# Patient Record
Sex: Male | Born: 1970 | Race: Black or African American | Hispanic: No | Marital: Married | Smoking: Current some day smoker
Health system: Southern US, Community
[De-identification: ages and names within clinical notes are randomized; demographics above are authoritative.]

## PROBLEM LIST (undated history)

## (undated) DIAGNOSIS — G4730 Sleep apnea, unspecified: Secondary | ICD-10-CM

## (undated) DIAGNOSIS — I10 Essential (primary) hypertension: Secondary | ICD-10-CM

---

## 2015-12-17 ENCOUNTER — Encounter (HOSPITAL_COMMUNITY): Payer: Self-pay | Admitting: Nurse Practitioner

## 2015-12-17 ENCOUNTER — Emergency Department (HOSPITAL_COMMUNITY)
Admission: EM | Admit: 2015-12-17 | Discharge: 2015-12-17 | Disposition: A | Discharge status: Home / Self Care | Payer: BLUE CROSS/BLUE SHIELD | Attending: Emergency Medicine | Admitting: Emergency Medicine

## 2015-12-17 DIAGNOSIS — Y939 Activity, unspecified: Secondary | ICD-10-CM | POA: Diagnosis not present

## 2015-12-17 DIAGNOSIS — T148XXA Other injury of unspecified body region, initial encounter: Secondary | ICD-10-CM

## 2015-12-17 DIAGNOSIS — M25562 Pain in left knee: Secondary | ICD-10-CM | POA: Insufficient documentation

## 2015-12-17 DIAGNOSIS — S76011A Strain of muscle, fascia and tendon of right hip, initial encounter: Secondary | ICD-10-CM | POA: Diagnosis not present

## 2015-12-17 DIAGNOSIS — F17210 Nicotine dependence, cigarettes, uncomplicated: Secondary | ICD-10-CM | POA: Diagnosis not present

## 2015-12-17 DIAGNOSIS — Y999 Unspecified external cause status: Secondary | ICD-10-CM | POA: Diagnosis not present

## 2015-12-17 DIAGNOSIS — Y929 Unspecified place or not applicable: Secondary | ICD-10-CM | POA: Diagnosis not present

## 2015-12-17 DIAGNOSIS — X58XXXA Exposure to other specified factors, initial encounter: Secondary | ICD-10-CM | POA: Diagnosis not present

## 2015-12-17 DIAGNOSIS — S79911A Unspecified injury of right hip, initial encounter: Secondary | ICD-10-CM | POA: Diagnosis present

## 2015-12-17 HISTORY — DX: Sleep apnea, unspecified: G47.30

## 2015-12-17 MED ORDER — NAPROXEN 500 MG PO TABS: 500.0000 mg | ORAL_TABLET | Freq: Two times a day (BID) | ORAL | Status: DC

## 2015-12-17 MED ORDER — CYCLOBENZAPRINE HCL 10 MG PO TABS: 10.0000 mg | ORAL_TABLET | Freq: Two times a day (BID) | ORAL | Status: DC | PRN

## 2015-12-17 NOTE — Discharge Instructions (Signed)
Joint Pain Joint pain, which is also called arthralgia, can be caused by many things. Joint pain often goes away when you follow your health care provider's instructions for relieving pain at home. However, joint pain can also be caused by conditions that require further treatment. Common causes of joint pain include:  Bruising in the area of the joint.  Overuse of the joint.  Wear and tear on the joints that occur with aging (osteoarthritis).  Various other forms of arthritis.  A buildup of a crystal form of uric acid in the joint (gout).  Infections of the joint (septic arthritis) or of the bone (osteomyelitis). Your health care provider may recommend medicine to help with the pain. If your joint pain continues, additional tests may be needed to diagnose your condition. HOME CARE INSTRUCTIONS Watch your condition for any changes. Follow these instructions as directed to lessen the pain that you are feeling.  Take medicines only as directed by your health care provider.  Rest the affected area for as long as your health care provider says that you should. If directed to do so, raise the painful joint above the level of your heart while you are sitting or lying down.  Do not do things that cause or worsen pain.  If directed, apply ice to the painful area:  Put ice in a plastic bag.  Place a towel between your skin and the bag.  Leave the ice on for 20 minutes, 2-3 times per day.  Wear an elastic bandage, splint, or sling as directed by your health care provider. Loosen the elastic bandage or splint if your fingers or toes become numb and tingle, or if they turn cold and blue.  Begin exercising or stretching the affected area as directed by your health care provider. Ask your health care provider what types of exercise are safe for you.  Keep all follow-up visits as directed by your health care provider. This is important. SEEK MEDICAL CARE IF:  Your pain increases, and  medicine does not help.  Your joint pain does not improve within 3 days.  You have increased bruising or swelling.  You have a fever.  You lose 10 lb (4.5 kg) or more without trying. SEEK IMMEDIATE MEDICAL CARE IF:  You are not able to move the joint.  Your fingers or toes become numb or they turn cold and blue.   This information is not intended to replace advice given to you by your health care provider. Make sure you discuss any questions you have with your health care provider.   Document Released: 05/24/2005 Document Revised: 06/14/2014 Document Reviewed: 03/05/2014 Elsevier Interactive Patient Education 2016 [employer] Groin Strain A groin strain (also called a groin pull) is an injury to the muscles or tendon on the upper inner part of the thigh. These muscles are called the adductor muscles or groin muscles. They are responsible for moving the leg across the body. A muscle strain occurs when a muscle is overstretched and some muscle fibers are torn. A groin strain can range from mild to severe depending on how many muscle fibers are affected and whether the muscle fibers are partially or completely torn.  Groin strains usually occur during exercise or participation in sports. The injury often happens when a sudden, violent force is placed on a muscle, stretching the muscle too far. A strain is more likely to occur when your muscles are not warmed up or if you are not properly conditioned. Depending on the  severity of the groin strain, recovery time may vary from a few weeks to several weeks. Severe injuries often require 4-6 weeks for recovery. In these cases, complete healing can take 4-5 months.  CAUSES   Stretching the groin muscles too far or too suddenly, often during side-to-side motion with an abrupt change in direction.  Putting repeated stress on the groin muscles over a long period of time.  Performing vigorous activity without properly stretching the groin muscles  beforehand. SYMPTOMS   Pain and tenderness in the groin area. This begins as sharp pain and persists as a dull ache.  Popping or snapping feeling when the injury occurs (for severe strains).  Swelling or bruising.  Muscle spasms.  Weakness in the leg.  Stiffness in the groin area with decreased ability to move the affected muscles. DIAGNOSIS  Your caregiver will perform a physical exam to diagnose a groin strain. You will be asked about your symptoms and how the injury occurred. X-rays are sometimes needed to rule out a broken bone or cartilage problems. Your caregiver may order a CT scan or MRI if a complete muscle tear is suspected. TREATMENT  A groin strain will often heal on its own. Your caregiver may prescribe medicines to help manage pain and swelling (anti-inflammatory medicine). You may be told to use crutches for the first few days to minimize your pain. HOME CARE INSTRUCTIONS   Rest. Do not use the strained muscle if it causes pain.  Put ice on the injured area.  Put ice in a plastic bag.  Place a towel between your skin and the bag.  Leave the ice on for 15-20 minutes, every 2-3 hours. Do this for the first 2 days after the injury.  Only take over-the-counter or prescription medicines as directed by your caregiver.  Wrap the injured area with an elastic bandage as directed by your caregiver.  Keep the injured leg raised (elevated).  Walk, stretch, and perform range-of-motion exercises to improve blood flow to the injured area. Only perform these activities if you can do so without any pain. To prevent muscle strains:  Warm up before exercise.  Develop proper conditioning and strength in the groin muscles. SEEK IMMEDIATE MEDICAL CARE IF:   You have increased pain or swelling in the affected area.   Your symptoms are not improving or are getting worse. MAKE SURE YOU:   Understand these instructions.  Will watch your condition.  Will get help right away  if you are not doing well or get worse.   This information is not intended to replace advice given to you by your health care provider. Make sure you discuss any questions you have with your health care provider.   Document Released: 01/20/2004 Document Revised: 05/10/2012 Document Reviewed: 01/26/2012 Elsevier Interactive Patient Education [employer]

## 2015-12-17 NOTE — ED Notes (Signed)
Pt reports R groin pain after having sexual intercourse this morning. Describes as a pulled muscle. Denies any urinary changes, bowel changes, testicular pain or swelling. Also c/o several month history of L knee pain. He is ambulatory, mae

## 2015-12-17 NOTE — ED Provider Notes (Signed)
CSN: [id number]     Arrival date & time 12/17/15  1320 History  By signing my name below, I, [facility] Medical GroupMarrissa Thornton, attest that this documentation has been prepared under the direction and in the presence of Jose Horsemanobert Haim Hansson, PA-C. Electronically Signed: Randell PatientMarrissa Thornton, ED Scribe. 12/17/2015. 1:48 PM.   Chief Complaint  Thornton presents with  . Leg Pain  . Knee Pain    The history is provided by the Thornton. No language interpreter was used.    HPI Comments: Jose MunroLavan Thornton is a 45 y.o. male who presents to the Emergency Department complaining of constant, moderate, right groin pain onset 11.5 hours ago. Pt states that he had sexual intercourse with his wife last night and earlier this morning followed by pain to his right groin. He reports difficulty moving secondary to pain and left knee pain that is baseline for him. Pain is worse with movement. Denies weakness, penile discharge, dysuria, or masses in the groin.  Past Medical History  Diagnosis Date  . Sleep apnea    History reviewed. No pertinent past surgical history. History reviewed. No pertinent family history. Social History  Substance Use Topics  . Smoking status: Current Every Day Smoker    Types: Cigarettes  . Smokeless tobacco: None  . Alcohol Use: Yes    Review of Systems  Genitourinary: Negative for dysuria and discharge.  Musculoskeletal: Positive for myalgias (right groin) and arthralgias (left knee, chronic).  All other systems reviewed and are negative.     Allergies  Review of Thornton's allergies indicates no known allergies.  Home Medications   Prior to Admission medications   Medication Sig Start Date End Date Taking? Authorizing Provider  cyclobenzaprine (FLEXERIL) 10 MG tablet Take 1 tablet (10 mg total) by mouth 2 (two) times daily as needed for muscle spasms. 12/17/15   Jose Horsemanobert Jaxxon Naeem, PA-C  naproxen (NAPROSYN) 500 MG tablet Take 1 tablet (500 mg total) by mouth 2 (two) times daily with a meal.  12/17/15   Jose Horsemanobert Jose Darko, PA-C   BP 185/85 mmHg  Pulse 69  Temp(Src) 98.6 F (37 C) (Oral)  Resp 17  SpO2 99% Physical Exam  Constitutional: He is oriented to person, place, and time. He appears well-developed and well-nourished.  HENT:  Head: Normocephalic and atraumatic.  Eyes: Conjunctivae and EOM are normal.  Neck: Normal range of motion.  Cardiovascular: Normal rate.   Pulmonary/Chest: Effort normal.  Abdominal: He exhibits no distension.  Genitourinary:  Chaperone present during exam, no penile discharge, no masses, no lesions, no testicular tenderness or abnormality, no hernia  Musculoskeletal: Normal range of motion.  Right lower extremity hip abductor muscles tender to palpation Range of motion and strength 5/5  Neurological: He is alert and oriented to person, place, and time.  Sensation intact throughout  Skin: Skin is dry.  Psychiatric: He has a normal mood and affect. His behavior is normal. Judgment and thought content normal.  Nursing note and vitals reviewed.   ED Course  Procedures   DIAGNOSTIC STUDIES: Oxygen Saturation is 99% on RA, normal by my interpretation.    COORDINATION OF CARE: 1:38 PM Will prescribe naproxen and Flexeril. Will provide pt with a referral to an orthopedist. Advised pt to follow-up with orthopedist for left knee pain. Will discharge pt. Discussed treatment plan with pt at bedside and pt agreed to plan.   MDM   Final diagnoses:  Muscle strain  Left knee pain    Thornton with symptoms consistent with muscle strain of right hip adductors.  No evidence of hernia, or other gross abnormality. Recommend orthopedic follow-up.  I personally performed the services described in this documentation, which was scribed in my presence. The recorded information has been reviewed and is accurate.      Jose Horseman, PA-C 12/17/15 1422  Gerhard Munch, MD 12/18/15 [phone]

## 2015-12-17 NOTE — ED Notes (Signed)
Declined W/C at D/C and was escorted to lobby by RN. 

## 2016-03-14 ENCOUNTER — Encounter (HOSPITAL_COMMUNITY): Payer: Self-pay | Admitting: Emergency Medicine

## 2016-03-14 ENCOUNTER — Emergency Department (HOSPITAL_COMMUNITY): Payer: BLUE CROSS/BLUE SHIELD

## 2016-03-14 DIAGNOSIS — Y939 Activity, unspecified: Secondary | ICD-10-CM | POA: Insufficient documentation

## 2016-03-14 DIAGNOSIS — Y92410 Unspecified street and highway as the place of occurrence of the external cause: Secondary | ICD-10-CM | POA: Insufficient documentation

## 2016-03-14 DIAGNOSIS — S161XXA Strain of muscle, fascia and tendon at neck level, initial encounter: Secondary | ICD-10-CM | POA: Diagnosis not present

## 2016-03-14 DIAGNOSIS — Y999 Unspecified external cause status: Secondary | ICD-10-CM | POA: Insufficient documentation

## 2016-03-14 DIAGNOSIS — R51 Headache: Secondary | ICD-10-CM | POA: Diagnosis not present

## 2016-03-14 DIAGNOSIS — F17210 Nicotine dependence, cigarettes, uncomplicated: Secondary | ICD-10-CM | POA: Insufficient documentation

## 2016-03-14 DIAGNOSIS — S0001XA Abrasion of scalp, initial encounter: Secondary | ICD-10-CM | POA: Insufficient documentation

## 2016-03-14 DIAGNOSIS — S39012A Strain of muscle, fascia and tendon of lower back, initial encounter: Secondary | ICD-10-CM | POA: Insufficient documentation

## 2016-03-14 DIAGNOSIS — R791 Abnormal coagulation profile: Secondary | ICD-10-CM | POA: Insufficient documentation

## 2016-03-14 DIAGNOSIS — S199XXA Unspecified injury of neck, initial encounter: Secondary | ICD-10-CM | POA: Diagnosis present

## 2016-03-14 LAB — URINE MICROSCOPIC-ADD ON

## 2016-03-14 LAB — CBC
HCT: 41.3 % (ref 39.0–52.0)
HEMOGLOBIN: 14.1 g/dL (ref 13.0–17.0)
MCH: 31.8 pg (ref 26.0–34.0)
MCHC: 34.1 g/dL (ref 30.0–36.0)
MCV: 93 fL (ref 78.0–100.0)
Platelets: 329 10*3/uL (ref 150–400)
RBC: 4.44 MIL/uL (ref 4.22–5.81)
RDW: 14.1 % (ref 11.5–15.5)
WBC: 8.4 10*3/uL (ref 4.0–10.5)

## 2016-03-14 LAB — URINALYSIS, ROUTINE W REFLEX MICROSCOPIC
Bilirubin Urine: NEGATIVE
GLUCOSE, UA: NEGATIVE mg/dL
KETONES UR: NEGATIVE mg/dL
LEUKOCYTES UA: NEGATIVE
NITRITE: NEGATIVE
PH: 6 (ref 5.0–8.0)
Protein, ur: NEGATIVE mg/dL
SPECIFIC GRAVITY, URINE: 1.025 (ref 1.005–1.030)

## 2016-03-14 LAB — I-STAT CHEM 8, ED
BUN: 7 mg/dL (ref 6–20)
CREATININE: 1 mg/dL (ref 0.61–1.24)
Calcium, Ion: 1.16 mmol/L (ref 1.15–1.40)
Chloride: 104 mmol/L (ref 101–111)
Glucose, Bld: 86 mg/dL (ref 65–99)
HEMATOCRIT: 43 % (ref 39.0–52.0)
HEMOGLOBIN: 14.6 g/dL (ref 13.0–17.0)
POTASSIUM: 3.5 mmol/L (ref 3.5–5.1)
Sodium: 142 mmol/L (ref 135–145)
TCO2: 28 mmol/L (ref 0–100)

## 2016-03-14 LAB — I-STAT CG4 LACTIC ACID, ED: Lactic Acid, Venous: 0.5 mmol/L (ref 0.5–1.9)
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
## 2016-03-14 NOTE — ED Notes (Signed)
C-collar applied in triage.

## 2016-03-15 ENCOUNTER — Emergency Department (HOSPITAL_COMMUNITY): Payer: BLUE CROSS/BLUE SHIELD

## 2016-03-15 ENCOUNTER — Encounter (HOSPITAL_COMMUNITY): Payer: Self-pay | Admitting: Radiology

## 2016-03-15 ENCOUNTER — Emergency Department (HOSPITAL_COMMUNITY)
Admission: EM | Admit: 2016-03-15 | Discharge: 2016-03-15 | Disposition: A | Discharge status: Home / Self Care | Payer: BLUE CROSS/BLUE SHIELD | Attending: Emergency Medicine | Admitting: Emergency Medicine

## 2016-03-15 DIAGNOSIS — S39012A Strain of muscle, fascia and tendon of lower back, initial encounter: Secondary | ICD-10-CM

## 2016-03-15 DIAGNOSIS — S161XXA Strain of muscle, fascia and tendon at neck level, initial encounter: Secondary | ICD-10-CM | POA: Diagnosis not present

## 2016-03-15 LAB — COMPREHENSIVE METABOLIC PANEL
ALBUMIN: 4.1 g/dL (ref 3.5–5.0)
ALT: 19 U/L (ref 17–63)
ANION GAP: 7 (ref 5–15)
AST: 25 U/L (ref 15–41)
Alkaline Phosphatase: 66 U/L (ref 38–126)
BILIRUBIN TOTAL: 0.7 mg/dL (ref 0.3–1.2)
BUN: 7 mg/dL (ref 6–20)
CHLORIDE: 106 mmol/L (ref 101–111)
CO2: 26 mmol/L (ref 22–32)
Calcium: 9.1 mg/dL (ref 8.9–10.3)
Creatinine, Ser: 1.05 mg/dL (ref 0.61–1.24)
GFR calc Af Amer: >60 mL/min (ref >60)
Glucose, Bld: 88 mg/dL (ref 65–99)
POTASSIUM: 3.4 mmol/L — AB (ref 3.5–5.1)
Sodium: 139 mmol/L (ref 135–145)
TOTAL PROTEIN: 7 g/dL (ref 6.5–8.1)

## 2016-03-15 LAB — ETHANOL

## 2016-03-15 LAB — PROTIME-INR
INR: 1.07
Prothrombin Time: 13.9 seconds (ref 11.4–15.2)

## 2016-03-15 MED ORDER — CYCLOBENZAPRINE HCL 10 MG PO TABS: 10.0000 mg | ORAL_TABLET | Freq: Two times a day (BID) | ORAL | 0 refills | Status: DC | PRN

## 2016-03-15 MED ORDER — NAPROXEN 500 MG PO TABS: 500.0000 mg | ORAL_TABLET | Freq: Two times a day (BID) | ORAL | 0 refills | Status: DC

## 2016-03-15 MED ORDER — HYDROCODONE-ACETAMINOPHEN 5-325 MG PO TABS
1.0000 | ORAL_TABLET | Freq: Once | ORAL | Status: AC
  Administered 2016-03-15: 1 via ORAL
  Filled 2016-03-15: qty 1

## 2016-03-15 MED ORDER — CYCLOBENZAPRINE HCL 10 MG PO TABS
5.0000 mg | ORAL_TABLET | Freq: Once | ORAL | Status: AC
  Administered 2016-03-15: 5 mg via ORAL
  Filled 2016-03-15: qty 1
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
## 2016-03-15 NOTE — ED Notes (Signed)
Pt stable, ambulatory, states understanding of discharge instructions 

## 2019-05-13 ENCOUNTER — Other Ambulatory Visit (HOSPITAL_COMMUNITY): Payer: Self-pay | Admitting: Genetic Counselor

## 2019-05-14 ENCOUNTER — Telehealth (HOSPITAL_COMMUNITY): Payer: Self-pay | Admitting: Genetic Counselor

## 2019-05-14 NOTE — Telephone Encounter (Signed)
I called Mr. Jose Thornton to discuss his negative carrier screening results for spinal muscular atrophy (SMA). 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 *3+80T>G SNP. This result reduces, but does not eliminate his risk to be a carrier for SMA. Based on the sensitivity of the screen, there is a 1 in 342 residual risk for Mr. Jose Thornton to be a carrier for SMA. Mr. Jose Thornton partner was identified to have a 1 in 90 chance of being a silent carrier for SMA. Given Mr. Jose Thornton negative results, the couple has a [phone] in [phone] chance to have a baby affected by SMA.  I asked Mr. Jose Thornton if he would like me to contact his partner, Jose Thornton by phone to inform her about these results. He agreed to this. Mr. Jose Thornton confirmed that he had no further questions at this time.  Buelah Manis, MS Genetic Counselor

## 2020-01-29 ENCOUNTER — Emergency Department (HOSPITAL_BASED_OUTPATIENT_CLINIC_OR_DEPARTMENT_OTHER): Payer: HRSA Program

## 2020-01-29 ENCOUNTER — Encounter (HOSPITAL_BASED_OUTPATIENT_CLINIC_OR_DEPARTMENT_OTHER): Payer: Self-pay | Admitting: *Deleted

## 2020-01-29 ENCOUNTER — Emergency Department (HOSPITAL_BASED_OUTPATIENT_CLINIC_OR_DEPARTMENT_OTHER)
Admission: EM | Admit: 2020-01-29 | Discharge: 2020-01-29 | Disposition: A | Discharge status: Home / Self Care | Payer: HRSA Program | Attending: Emergency Medicine | Admitting: Emergency Medicine

## 2020-01-29 ENCOUNTER — Other Ambulatory Visit: Payer: Self-pay

## 2020-01-29 DIAGNOSIS — R05 Cough: Secondary | ICD-10-CM | POA: Diagnosis present

## 2020-01-29 DIAGNOSIS — U071 COVID-19: Secondary | ICD-10-CM

## 2020-01-29 DIAGNOSIS — F17210 Nicotine dependence, cigarettes, uncomplicated: Secondary | ICD-10-CM | POA: Diagnosis not present

## 2020-01-29 LAB — SARS CORONAVIRUS 2 BY RT PCR (HOSPITAL ORDER, PERFORMED IN ~~LOC~~ HOSPITAL LAB): SARS Coronavirus 2: POSITIVE — AB

## 2020-01-29 MED ORDER — ACETAMINOPHEN 325 MG PO TABS
650.0000 mg | ORAL_TABLET | Freq: Once | ORAL | Status: AC
  Administered 2020-01-29: 650 mg via ORAL
  Filled 2020-01-29: qty 2

## 2020-01-29 MED ORDER — DEXAMETHASONE 6 MG PO TABS
6.0000 mg | ORAL_TABLET | Freq: Once | ORAL | Status: AC
  Administered 2020-01-29: 6 mg via ORAL
  Filled 2020-01-29: qty 1

## 2020-01-29 NOTE — ED Provider Notes (Signed)
[facility] EMERGENCY DEPARTMENT Provider Note   CSN: [id number] Arrival date & time: 01/29/20  1209     History Chief Complaint  Patient presents with  . Cough    Jose Thornton is a 49 y.o. male with past medical history significant for sleep apnea and tobacco abuse.  HPI Patient presents to emergency department today with chief complaint of cough, shortness of breath and nasal congestion x1 week.  He states his symptoms have progressively worsened since onset.  He had a negative Covid test [facility].  He has 2 young children that are positive for RSV.  Patient states his cough is nonproductive.  No shortness of breath after long coughing fit.  He has tried taking TheraFlu at home without symptom improvement.  He has received his first Covid back his second for tomorrow.  He denies fever, chills, hemoptysis, chest pain, abdominal pain, urinary symptoms, diarrhea, lower extremity edema, rash.     Past Medical History:  Diagnosis Date  . Sleep apnea     There are no problems to display for this patient.   History reviewed. No pertinent surgical history.     No family history on file.  Social History   Tobacco Use  . Smoking status: Current Every Day Smoker    Types: Cigarettes  . Smokeless tobacco: Never Used  Substance Use Topics  . Alcohol use: Yes  . Drug use: Yes    Types: Marijuana    Home Medications Prior to Admission medications   Medication Sig Start Date End Date Taking? Authorizing Provider  cyclobenzaprine (FLEXERIL) 10 MG tablet Take 1 tablet (10 mg total) by mouth 2 (two) times daily as needed for muscle spasms. 03/15/16   Horton, Mayer Masker, MD  naproxen (NAPROSYN) 500 MG tablet Take 1 tablet (500 mg total) by mouth 2 (two) times daily. 03/15/16   Horton, Mayer Masker, MD    Allergies    Patient has no known allergies.  Review of Systems   Review of Systems All other systems are reviewed and are negative for  acute change except as noted in the HPI.  Physical Exam Updated Vital Signs BP (!) 172/104   Pulse (!) 57   Temp 99.4 F (37.4 C) (Oral)   Resp 20   Ht 6\' 4"  (1.93 m)   Wt 129.7 kg   SpO2 98%   BMI 34.81 kg/m   Physical Exam Vitals and nursing note reviewed.  Constitutional:      General: He is not in acute distress.    Appearance: He is not ill-appearing.  HENT:     Head: Normocephalic and atraumatic.     Right Ear: Tympanic membrane and external ear normal.     Left Ear: Tympanic membrane and external ear normal.     Nose: Congestion present.     Mouth/Throat:     Mouth: Mucous membranes are moist.     Pharynx: Oropharynx is clear.  Eyes:     General: No scleral icterus.       Right eye: No discharge.        Left eye: No discharge.     Extraocular Movements: Extraocular movements intact.     Conjunctiva/sclera: Conjunctivae normal.     Pupils: Pupils are equal, round, and reactive to light.  Neck:     Vascular: No JVD.  Cardiovascular:     Rate and Rhythm: Normal rate and regular rhythm.     Pulses: Normal pulses.  Radial pulses are 2+ on the right side and 2+ on the left side.     Heart sounds: Normal heart sounds.  Pulmonary:     Comments: Lungs clear to auscultation in all fields. Symmetric chest rise. No wheezing, rales, or rhonchi.  Oxygen saturation is 98% on room air.  He is talking in full sentences.  No respiratory distress. Chest:     Chest wall: No tenderness.  Abdominal:     Comments: Abdomen is soft, non-distended, and non-tender in all quadrants. No rigidity, no guarding. No peritoneal signs.  Musculoskeletal:        General: Normal range of motion.     Cervical back: Normal range of motion.  Skin:    General: Skin is warm and dry.     Capillary Refill: Capillary refill takes less than 2 seconds.     Findings: No rash.  Neurological:     Mental Status: He is oriented to person, place, and time.     GCS: GCS eye subscore is 4. GCS verbal  subscore is 5. GCS motor subscore is 6.     Comments: Fluent speech, no facial droop.  Psychiatric:        Behavior: Behavior normal.     ED Results / Procedures / Treatments   Labs (all labs ordered are listed, but only abnormal results are displayed) Labs Reviewed  SARS CORONAVIRUS 2 BY RT PCR (HOSPITAL ORDER, PERFORMED IN [facility]) - Abnormal; Notable for the following components:      Result Value   SARS Coronavirus 2 POSITIVE (*)    All other components within normal limits    EKG EKG Interpretation  Date/Time:  Tuesday January 29 2020 12:52:28 EDT Ventricular Rate:  72 PR Interval:  144 QRS Duration: 138 QT Interval:  442 QTC Calculation: 483 R Axis:   -69 Text Interpretation: Normal sinus rhythm with sinus arrhythmia Right bundle branch block Left anterior fascicular block ** Bifascicular block ** Minimal voltage criteria for LVH, may be normal variant ( R in aVL ) Abnormal ECG rate is faster compared to 2017 Confirmed by Pricilla Loveless [phone]) on 01/29/2020 12:56:33 PM   Radiology DG Chest Portable 1 View  Result Date: 01/29/2020 CLINICAL DATA:  Cough, shortness of breath, fever.  COVID positive. EXAM: PORTABLE CHEST 1 VIEW COMPARISON:  Radiograph 01/15/2016 FINDINGS: Mild cardiomegaly. Unchanged mediastinal contours. Pleuroparenchymal opacity at the left lung base was also seen on 2017 exam and may be chronic. There are new patchy opacities at the right mid and lower lung zone. Small right pleural effusion not seen on prior exam. No pneumothorax. IMPRESSION: 1. Patchy opacities at the right mid and lower lung zone, consistent pneumonia. 2. Small right pleural effusion. 3. Unchanged pleuroparenchymal opacity at the left lung base since 2017, may represent scarring 4. Mild cardiomegaly. Electronically Signed   By: Narda Rutherford M.D.   On: 01/29/2020 15:11    Procedures Procedures (including critical care time)  Medications Ordered in ED Medications    acetaminophen (TYLENOL) tablet 650 mg (650 mg Oral Given 01/29/20 1452)  dexamethasone (DECADRON) tablet 6 mg (6 mg Oral Given 01/29/20 1536)    ED Course  I have reviewed the triage vital signs and the nursing notes.  Pertinent labs & imaging results that were available during my care of the patient were reviewed by me and considered in my medical decision making (see chart for details).    MDM Rules/Calculators/A&P  History provided by patient with additional history obtained from chart review.    49 yo male presenting with URI symptoms. He has sick contacts +RSV at home. He is afebrile, no tachycardia, or hypoxia.  On exam he is well appearing. He has no sinus tenderness.  Does not appear dehydrated.  He does have nasal congestion.  His lungs are clear to auscultation in all fields.  He has normal work of breathing. Covid test is positive. Chest x-ray viewed by me shows patchy opacities at the right mid and lower lung zone, suggestive of pneumonia. Also small right pleural effusion. EKG without stemi. Patient ambulated in the emergency department without respiratory distress or hypoxia, SpO2 >95% on room air.  Case discussed with ED attending Dr. Criss Alvine who agrees with plan to discharge with symptomatic treatment as he has reassuring exam and vitals. Referral sent to [facility] infusion as he meets criteria with BMI. Also recommend telemedicine PCP f/u in the next 2-3 days for persistent symptoms  for further guidance. Self-isolation instructions discussed. Pt was given home self-isolation instructions and instructions for family members.   Pt understands signs and symptoms that would warrant return to ED.  Pt comfortable and agreeable with POC.  Jose Thornton was evaluated in Emergency Department on 01/29/2020 for the symptoms described in the history of present illness. He was evaluated in the context of the global COVID-19 pandemic, which necessitated consideration that  the patient might be at risk for infection with the SARS-CoV-2 virus that causes COVID-19. Institutional protocols and algorithms that pertain to the evaluation of patients at risk for COVID-19 are in a state of rapid change based on information released by regulatory bodies including the CDC and federal and state organizations. These policies and algorithms were followed during the patient's care in the ED.   Portions of this note were generated with Scientist, clinical (histocompatibility and immunogenetics). Dictation errors may occur despite best attempts at proofreading.  Final Clinical Impression(s) / ED Diagnoses Final diagnoses:  COVID-19 virus infection    Rx / DC Orders ED Discharge Orders    None       Kathyrn Lass 01/29/20 2333    Pricilla Loveless, MD 02/01/20 [phone]

## 2020-01-29 NOTE — ED Triage Notes (Signed)
Cough, sob, congestion. His kids are positive for RSV. He had a negative Covid test yesterday.

## 2020-01-29 NOTE — ED Notes (Signed)
Pt. Reports he [facility] his covid test [facility].

## 2020-01-29 NOTE — ED Notes (Signed)
Pt. Able to walk around  With sats staying at 94% to 96% on RA.  Pt. Has cough with speech.

## 2020-01-29 NOTE — Discharge Instructions (Addendum)
You tested positive for Covid today.   A provider from the infusion clinic will call you to schedule the clonal antibody infusion.  Thank you for allowing [place] to care for you today.   Please return to the emergency department if you have any new or worsening symptoms.    Medications- You can take medications to help treat your symptoms: -Tylenol for fever and body aches. Please take as prescribed on the bottle. -Over the coutner cough medicine such as mucinex, robitussin, or other brands. -Flonase or saline nasal spray for nasal congestion -Vitamins as recommended by CDC  Treatment- This is a virus and unfortunately there are no antibitotics approved to treat this virus at this time. It is important to monitor your symptoms closely: -You should have a theremometer at home to check your temperature when feeling feverish. -Use a pulse ox meter to measure your oxygen when feeling short of breath.  -If your fever is over 100.4 despite taking tylenol or if your oxygen level drops below 94% these are reasons to rturn to the emergency department for further evaluation. Please call the emergency department before you come to make [place] aware.    We recommend you self-isolate for 10 days and to inform your work/family/friends that you has the virus.  They will need to self-quarantine for 14 days to monitor for symptoms.    Again: symptoms of shortness of breath, chest pain, difficulty breathing, new onset of confusion, any symptoms that are concerning. If any of these symptoms you should come to emergency department for evaluation.   I hope you feel better soon

## 2020-01-29 NOTE — ED Notes (Signed)
Pt discharged to home. Discharge instructions have been discussed with patient and/or family members. Pt verbally acknowledges understanding d/c instructions, and endorses comprehension that no need to checkout at registration before leaving.

## 2020-01-30 ENCOUNTER — Telehealth: Payer: Self-pay | Admitting: Unknown Physician Specialty

## 2020-01-30 NOTE — Telephone Encounter (Signed)
Called to discuss with Jose Thornton about Covid symptoms and the use of bamlanivimab, a monoclonal antibody infusion for those with mild to moderate Covid symptoms and at a high risk of hospitalization.     Pt is qualified for this infusion at the [facility] infusion center due to co-morbid conditions and/or a member of an at-risk group, however declines infusion at this time. Symptoms tier reviewed as well as criteria for ending isolation.  Symptoms reviewed that would warrant ED/Hospital evaluation. Preventative practices reviewed. Patient verbalized understanding. Patient advised to call back if he decides that he does want to get infusion. Callback number to the infusion center given. Patient advised to go to Urgent care or ED with severe symptoms. Unable to afford the cost of infusion plus beyond 7 days, the optimal window for benefit.    Sx onset 8/16

## 2021-02-06 ENCOUNTER — Ambulatory Visit (HOSPITAL_COMMUNITY)
Admission: EM | Admit: 2021-02-06 | Discharge: 2021-02-06 | Disposition: A | Discharge status: Home / Self Care | Payer: 59 | Attending: Emergency Medicine | Admitting: Emergency Medicine

## 2021-02-06 ENCOUNTER — Encounter (HOSPITAL_COMMUNITY): Payer: Self-pay | Admitting: Emergency Medicine

## 2021-02-06 DIAGNOSIS — Z20822 Contact with and (suspected) exposure to covid-19: Secondary | ICD-10-CM | POA: Insufficient documentation

## 2021-02-06 DIAGNOSIS — J069 Acute upper respiratory infection, unspecified: Secondary | ICD-10-CM | POA: Insufficient documentation

## 2021-02-06 LAB — SARS CORONAVIRUS 2 (TAT 6-24 HRS): SARS Coronavirus 2: NEGATIVE

## 2021-02-06 NOTE — Discharge Instructions (Signed)

## 2021-02-06 NOTE — ED Triage Notes (Signed)
Pt is present today with right ear pain, nasal congestion, chest congestion, and sore throat. Pt states that his sx started Wednesday

## 2021-02-06 NOTE — ED Provider Notes (Signed)
[facility]    CSN: [id number] Arrival date & time: 02/06/21  0808      History   Chief Complaint Chief Complaint  Patient presents with   Nasal Congestion   Otalgia   Sore Throat    HPI Jose Thornton is a 50 y.o. male.   Patient here for evaluation of nasal congestion, ear pain, and sore throat that have been ongoing since Wednesday.  Reports taking Tylenol with minimal relief.  Denies any recent sick contacts but does state that family recently had COVID several weeks ago.  Denies any trauma, injury, or other precipitating event.  Denies any specific alleviating or aggravating factors.  Denies any fevers, chest pain, shortness of breath, N/V/D, numbness, tingling, weakness, abdominal pain, or headaches.    The history is provided by the patient.  Otalgia Associated symptoms: congestion, cough, rhinorrhea and sore throat   Associated symptoms: no fever   Sore Throat Pertinent negatives include no chest pain and no shortness of breath.   Past Medical History:  Diagnosis Date   Sleep apnea     There are no problems to display for this patient.   History reviewed. No pertinent surgical history.     Home Medications    Prior to Admission medications   Medication Sig Start Date End Date Taking? Authorizing Provider  cyclobenzaprine (FLEXERIL) 10 MG tablet Take 1 tablet (10 mg total) by mouth 2 (two) times daily as needed for muscle spasms. 03/15/16   Horton, Mayer Masker, MD  naproxen (NAPROSYN) 500 MG tablet Take 1 tablet (500 mg total) by mouth 2 (two) times daily. 03/15/16   Horton, Mayer Masker, MD    Family History History reviewed. No pertinent family history.  Social History Social History   Tobacco Use   Smoking status: Every Day    Types: Cigarettes   Smokeless tobacco: Never  Substance Use Topics   Alcohol use: Yes   Drug use: Yes    Types: Marijuana     Allergies   Patient has no known allergies.   Review of Systems Review of  Systems  Constitutional:  Negative for fever.  HENT:  Positive for congestion, ear pain, rhinorrhea, sore throat and trouble swallowing.   Respiratory:  Positive for cough. Negative for chest tightness and shortness of breath.   Cardiovascular:  Negative for chest pain.  All other systems reviewed and are negative.   Physical Exam Triage Vital Signs ED Triage Vitals  Enc Vitals Group     BP 02/06/21 0824 (!) 184/117     Pulse Rate 02/06/21 0822 68     Resp 02/06/21 0822 18     Temp 02/06/21 0822 98.9 F (37.2 C)     Temp Source 02/06/21 0822 Oral     SpO2 02/06/21 0824 93 %     Weight --      Height --      Head Circumference --      Peak Flow --      Pain Score 02/06/21 0826 0     Pain Loc --      Pain Edu? --      Excl. in GC? --    No data found.  Updated Vital Signs BP (!) 184/117 Comment: 203/117  Pulse 68   Temp 98.9 F (37.2 C) (Oral)   Resp 18   SpO2 93%   Visual Acuity Right Eye Distance:   Left Eye Distance:   Bilateral Distance:    Right Eye Near:  Left Eye Near:    Bilateral Near:     Physical Exam Vitals and nursing note reviewed.  Constitutional:      General: He is not in acute distress.    Appearance: Normal appearance. He is not ill-appearing, toxic-appearing or diaphoretic.  HENT:     Head: Normocephalic and atraumatic.     Right Ear: Tympanic membrane and ear canal normal.     Left Ear: Tympanic membrane and ear canal normal.     Nose: Congestion and rhinorrhea present.     Mouth/Throat:     Pharynx: Uvula midline. Pharyngeal swelling and posterior oropharyngeal erythema present. No oropharyngeal exudate.     Tonsils: No tonsillar exudate or tonsillar abscesses. 2+ on the right. 2+ on the left.  Eyes:     Conjunctiva/sclera: Conjunctivae normal.     Pupils: Pupils are equal, round, and reactive to light.  Cardiovascular:     Rate and Rhythm: Normal rate and regular rhythm.     Pulses: Normal pulses.     Heart sounds: Normal heart  sounds.  Pulmonary:     Effort: Pulmonary effort is normal.     Breath sounds: Normal breath sounds.  Abdominal:     General: Abdomen is flat.  Musculoskeletal:        General: Normal range of motion.     Cervical back: Normal range of motion.  Skin:    General: Skin is warm and dry.  Neurological:     General: No focal deficit present.     Mental Status: He is alert and oriented to person, place, and time.  Psychiatric:        Mood and Affect: Mood normal.     UC Treatments / Results  Labs (all labs ordered are listed, but only abnormal results are displayed) Labs Reviewed  SARS CORONAVIRUS 2 (TAT 6-24 HRS)    EKG   Radiology No results found.  Procedures Procedures (including critical care time)  Medications Ordered in UC Medications - No data to display  Initial Impression / Assessment and Plan / UC Course  I have reviewed the triage vital signs and the nursing notes.  Pertinent labs & imaging results that were available during my care of the patient were reviewed by me and considered in my medical decision making (see chart for details).  Clinical Course as of 02/06/21 0851  Fri Feb 06, 2021  0827 BP(!): 184/117 [FS]    Clinical Course User Index [FS] Ivette Loyal, NP   Assessment negative for red flags or concerns.  COVID test pending.  Discussed current CDC guidelines for quarantine.  Work note given to patient.  Tylenol and/or ibuprofen as needed.  Discussed conservative symptom management as described in discharge instructions.  Encourage fluids and rest.  Follow-up as needed Final Clinical Impressions(s) / UC Diagnoses   Final diagnoses:  Viral URI     Discharge Instructions      We will contact you if your COVID test is positive.  Please quarantine while you wait for the results.  If your test is negative you may resume normal activities.  If your test is positive please continue to quarantine for at least 5 days from your symptom onset or  until you are without a fever for at least 24 hours after the medications.  You can take Tylenol and/or Ibuprofen as needed for fever reduction and pain relief.   For cough: honey 1/2 to 1 teaspoon (you can dilute the honey in water or  another fluid).  You can also use guaifenesin and dextromethorphan for cough. You can use a humidifier for chest congestion and cough.  If you don't have a humidifier, you can sit in the bathroom with the hot shower running.     For sore throat: try warm salt water gargles, cepacol lozenges, throat spray, warm tea or water with lemon/honey, popsicles or ice, or OTC cold relief medicine for throat discomfort.    For congestion: take a daily anti-histamine like Zyrtec, Claritin, and a oral decongestant, such as pseudoephedrine.  You can also use Flonase 1-2 sprays in each nostril daily.    It is important to stay hydrated: drink plenty of fluids (water, gatorade/powerade/pedialyte, juices, or teas) to keep your throat moisturized and help further relieve irritation/discomfort.   Return or go to the Emergency Department if symptoms worsen or do not improve in the next few days.      ED Prescriptions   None    PDMP not reviewed this encounter.   Ivette Loyal, NP 02/06/21 [phone]

## 2022-02-15 IMAGING — DX DG CHEST 1V PORT
1 series · 1 of 1 positions shown · non-contrast
Comparison: Radiograph 01/15/2016

CLINICAL DATA: Cough, shortness of breath, fever.  COVID positive.

EXAM:
PORTABLE CHEST 1 VIEW

[chest ap]
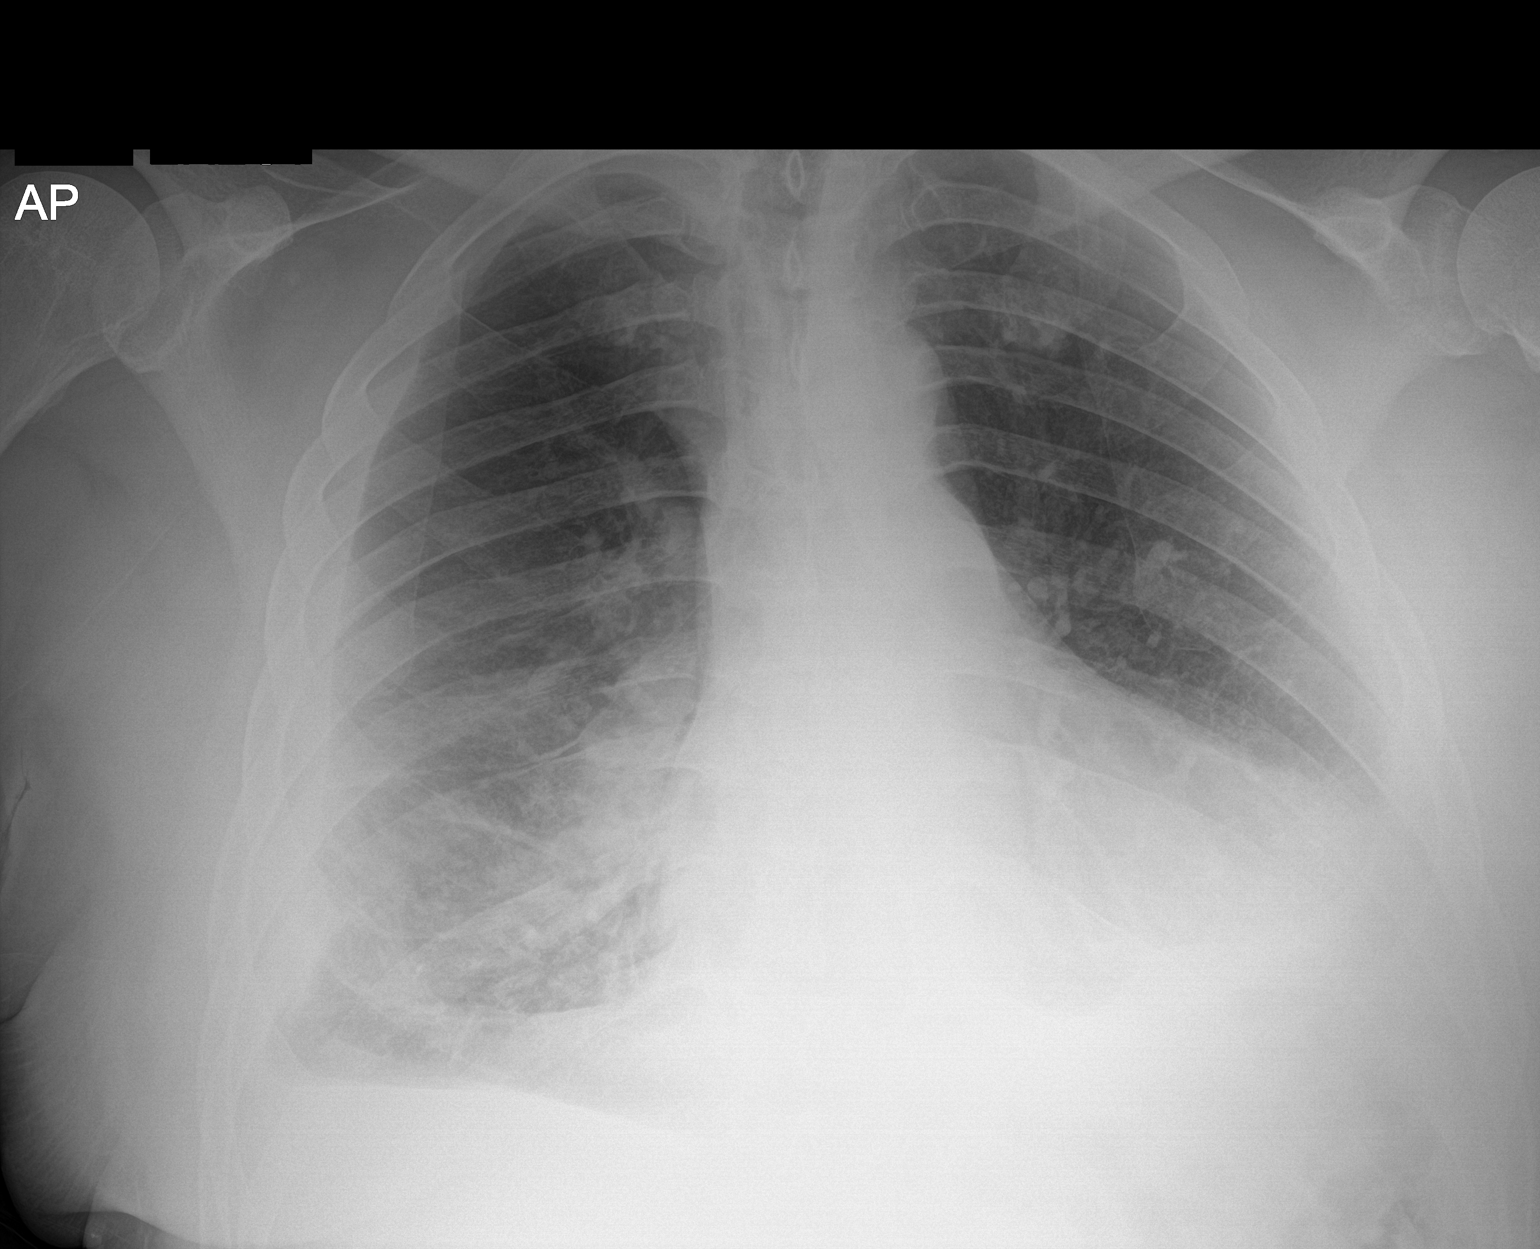

[1 of 1 positions shown; findings below may reference images not displayed]

FINDINGS: Mild cardiomegaly. Unchanged mediastinal contours. Pleuroparenchymal
opacity at the left lung base was also seen on 0428 exam and may be
chronic. There are new patchy opacities at the right mid and lower
lung zone. Small right pleural effusion not seen on prior exam. No
pneumothorax.
IMPRESSION: 1. Patchy opacities at the right mid and lower lung zone, consistent
pneumonia.
2. Small right pleural effusion.
3. Unchanged pleuroparenchymal opacity at the left lung base since
[DATE] represent scarring
4. Mild cardiomegaly.

## 2022-03-13 ENCOUNTER — Telehealth: Payer: Self-pay

## 2022-03-13 ENCOUNTER — Ambulatory Visit
Admission: EM | Admit: 2022-03-13 | Discharge: 2022-03-13 | Disposition: A | Discharge status: Home / Self Care | Payer: 59 | Attending: Emergency Medicine | Admitting: Emergency Medicine

## 2022-03-13 DIAGNOSIS — S025XXA Fracture of tooth (traumatic), initial encounter for closed fracture: Secondary | ICD-10-CM

## 2022-03-13 DIAGNOSIS — K047 Periapical abscess without sinus: Secondary | ICD-10-CM | POA: Diagnosis not present

## 2022-03-13 MED ORDER — PENICILLIN G BENZATHINE 1200000 UNIT/2ML IM SUSY
2.4000 10*6.[IU] | PREFILLED_SYRINGE | Freq: Once | INTRAMUSCULAR | Status: AC
  Administered 2022-03-13: 2.4 10*6.[IU] via INTRAMUSCULAR

## 2022-03-13 MED ORDER — AMOXICILLIN-POT CLAVULANATE 875-125 MG PO TABS: 1.0000 | ORAL_TABLET | Freq: Two times a day (BID) | ORAL | 0 refills | Status: AC

## 2022-03-13 MED ORDER — IBUPROFEN 800 MG PO TABS
800.0000 mg | ORAL_TABLET | Freq: Once | ORAL | Status: AC
  Administered 2022-03-13: 800 mg via ORAL

## 2022-03-13 MED ORDER — AMOXICILLIN-POT CLAVULANATE 875-125 MG PO TABS: 1.0000 | ORAL_TABLET | Freq: Two times a day (BID) | ORAL | 0 refills | Status: DC

## 2022-03-13 NOTE — ED Provider Notes (Signed)
[facility]    CSN: [id number] Arrival date & time: 03/13/22  6440    HISTORY   Chief Complaint  Patient presents with   Dental Pain   HPI Jose Thornton is a pleasant, 51 y.o. male who presents to urgent care today. Pt c/o throbbing dental pain to left lower row of teeth and now has swelling to his face.  Patient states his #18 molar broke a few weeks ago but has not been bothering him until last night.  Patient states he began to be very painful so he put some Orajel on it before going to work third shift last night.  Patient states the pain is becoming bearable and overnight began to have swelling of his jaw which is now spread all the way up the left side of his face, states his jaw is tender to palpation and feels warm.  Patient denies fever, headache, otalgia, difficulty swallowing.   Dental Pain Location:  Lower Lower teeth location:  18/LL 2nd molar Quality:  Aching, burning, constant and localized Severity:  Severe Onset quality:  Sudden Duration:  12 hours Timing:  Constant Progression:  Worsening Chronicity:  New Context: abscess and dental fracture   Relieved by:  Nothing Worsened by:  Cold food/drink, hot food/drink, touching, jaw movement and pressure Ineffective treatments:  Topical anesthetic gel Associated symptoms: facial pain, facial swelling and gum swelling   Associated symptoms: no congestion, no difficulty swallowing, no drooling, no fever, no headaches, no neck pain, no neck swelling, no oral bleeding, no oral lesions and no trismus   Risk factors: lack of dental care and smoking    Past Medical History:  Diagnosis Date   Sleep apnea    There are no problems to display for this patient.  History reviewed. No pertinent surgical history.  Home Medications    Prior to Admission medications   Medication Sig Start Date End Date Taking? Authorizing Provider    Family History History reviewed. No pertinent family history. Social  History Social History   Tobacco Use   Smoking status: Every Day    Types: Cigarettes   Smokeless tobacco: Never  Substance Use Topics   Alcohol use: Yes   Drug use: Yes    Types: Marijuana   Allergies   Patient has no known allergies.  Review of Systems Review of Systems  Constitutional:  Negative for fever.  HENT:  Positive for facial swelling. Negative for congestion, drooling and mouth sores.   Musculoskeletal:  Negative for neck pain.  Neurological:  Negative for headaches.   Pertinent findings revealed after performing a 14 point review of systems has been noted in the history of present illness.  Physical Exam Triage Vital Signs ED Triage Vitals  Enc Vitals Group     BP 04/03/21 0827 (!) 147/82     Pulse Rate 04/03/21 0827 72     Resp 04/03/21 0827 18     Temp 04/03/21 0827 98.3 F (36.8 C)     Temp Source 04/03/21 0827 Oral     SpO2 04/03/21 0827 98 %     Weight --      Height --      Head Circumference --      Peak Flow --      Pain Score 04/03/21 0826 5     Pain Loc --      Pain Edu? --      Excl. in [place]? --   No data found.  Updated Vital Signs BP Marland Kitchen)  166/98 (BP Location: Left Arm)   Pulse (!) 58   Temp 98.1 F (36.7 C) (Oral)   Resp 18   SpO2 95%   Physical Exam  Visual Acuity Right Eye Distance:   Left Eye Distance:   Bilateral Distance:    Right Eye Near:   Left Eye Near:    Bilateral Near:     UC Couse / Diagnostics / Procedures:     Radiology No results found.  Procedures Procedures (including critical care time) EKG  Pending results:  Labs Reviewed - No data to display  Medications Ordered in UC: Medications  penicillin g benzathine (BICILLIN LA) 1200000 UNIT/2ML injection 2.4 Million Units (has no administration in time range)  ibuprofen (ADVIL) tablet 800 mg (has no administration in time range)    UC Diagnoses / Final Clinical Impressions(s)   I have reviewed the triage vital signs and the nursing  notes.  Pertinent labs & imaging results that were available during my care of the patient were reviewed by me and considered in my medical decision making (see chart for details).    Final diagnoses:  Closed fracture of tooth, initial encounter  Dental abscess   Patient provided with ibuprofen 800 mg and injection of penicillin regularly for symptoms, patient states he was at work Mid[employer].  Patient also advised to pick up and begin taking Augmentin, first dose this evening.  Patient given contact information for local dentist and advised to schedule appointment as soon as possible to have acute repair.  Removed to prevent further infection.  Return precautions advised.  ED Prescriptions     Medication Sig Dispense Auth. Provider   amoxicillin-clavulanate (AUGMENTIN) 875-125 MG tablet Take 1 tablet by mouth 2 (two) times daily for 7 days. 14 tablet Lynden Oxford Scales, PA-C      PDMP not reviewed this encounter.  Pending results:  Labs Reviewed - No data to display  Discharge Instructions:   Discharge Instructions      During your visit today you received an injection of penicillin that should significantly reduce your pain and swelling.  We also provided you with ibuprofen to reduce the rest of your pain.  I sent a prescription for ibuprofen to your pharmacy as well.  Please pick up and begin taking Augmentin twice daily, please take your first dose this evening.  Please be sure that you finish the full 7-day course.  Because your tooth is broken, reinfection is very likely until the tooth is either repaired or removed.  Please make a dental appointment as soon as possible for evaluation and treatment of the tooth.  Thank you for visiting urgent care today.  Please let us know if there is nothing else we can do for you.      Disposition Upon Discharge:  Condition: stable for discharge home  Patient presented with an acute illness with associated systemic symptoms and  significant discomfort requiring urgent management. In my opinion, this is a condition that a prudent lay person (someone who possesses an average knowledge of health and medicine) may potentially expect to result in complications if not addressed urgently such as respiratory distress, impairment of bodily function or dysfunction of bodily organs.   Routine symptom specific, illness specific and/or disease specific instructions were discussed with the patient and/or caregiver at length.   As such, the patient has been evaluated and assessed, work-up was performed and treatment was provided in alignment with urgent care protocols and evidence based medicine.  Patient/parent/caregiver has been  advised that the patient may require follow up for further testing and treatment if the symptoms continue in spite of treatment, as clinically indicated and appropriate.  Patient/parent/caregiver has been advised to return to the [facility] or PCP if no better; to PCP or the Emergency Department if new signs and symptoms develop, or if the current signs or symptoms continue to change or worsen for further workup, evaluation and treatment as clinically indicated and appropriate  The patient will follow up with their current PCP if and as advised. If the patient does not currently have a PCP we will assist them in obtaining one.   The patient may need specialty follow up if the symptoms continue, in spite of conservative treatment and management, for further workup, evaluation, consultation and treatment as clinically indicated and appropriate.   Patient/parent/caregiver verbalized understanding and agreement of plan as discussed.  All questions were addressed during visit.  Please see discharge instructions below for further details of plan.  This office note has been dictated using Teaching laboratory technician.  Unfortunately, this method of dictation can sometimes lead to typographical or grammatical errors.  I  apologize for your inconvenience in advance if this occurs.  Please do not hesitate to reach out to me if clarification is needed.      Theadora Rama Scales, PA-C 03/13/22 1308

## 2022-03-13 NOTE — ED Triage Notes (Signed)
Pt c/o throbbing dental pain to left lower row of teeth and now has swelling to his face.   Started: yesterday   Home interventions: Orajel

## 2022-03-13 NOTE — Discharge Instructions (Addendum)
During your visit today you received an injection of penicillin that should significantly reduce your pain and swelling.  We also provided you with ibuprofen to reduce the rest of your pain.  I sent a prescription for ibuprofen to your pharmacy as well.  Please pick up and begin taking Augmentin twice daily, please take your first dose this evening.  Please be sure that you finish the full 7-day course.  Because your tooth is broken, reinfection is very likely until the tooth is either repaired or removed.  Please make a dental appointment as soon as possible for evaluation and treatment of the tooth.  Thank you for visiting urgent care today.  Please let us know if there is nothing else we can do for you.

## 2022-06-01 ENCOUNTER — Ambulatory Visit
Admission: EM | Admit: 2022-06-01 | Discharge: 2022-06-01 | Disposition: A | Discharge status: Home / Self Care | Payer: 59 | Attending: Emergency Medicine | Admitting: Emergency Medicine

## 2022-06-01 DIAGNOSIS — I160 Hypertensive urgency: Secondary | ICD-10-CM

## 2022-06-01 DIAGNOSIS — R0602 Shortness of breath: Secondary | ICD-10-CM | POA: Diagnosis not present

## 2022-06-01 DIAGNOSIS — R062 Wheezing: Secondary | ICD-10-CM | POA: Diagnosis not present

## 2022-06-01 DIAGNOSIS — R7611 Nonspecific reaction to tuberculin skin test without active tuberculosis: Secondary | ICD-10-CM | POA: Diagnosis not present

## 2022-06-01 MED ORDER — CLONIDINE HCL 0.3 MG PO TABS
0.3000 mg | ORAL_TABLET | Freq: Once | ORAL | Status: AC
  Administered 2022-06-01: 0.3 mg via ORAL

## 2022-06-01 NOTE — Discharge Instructions (Signed)
Please go to the emergency room now for further treatment of your elevated blood pressure and wheezing.

## 2022-06-01 NOTE — ED Provider Notes (Signed)
[facility]    CSN: [id number] Arrival date & time: 06/01/22  1823    HISTORY   Chief Complaint  Patient presents with   Cough   HPI Jose Thornton is a pleasant, 51 y.o. male who presents to urgent care today. Patient complains of coughing and chest congestion for the past 3 days.  Patient states he is coughed so hard the past 2 days that he is lost his voice.  On arrival, patient's blood pressure was 201/116.  Patient states he has not been taking his blood pressure medication, amlodipine olmesartan.  Patient was provided with clonidine 0.3 mg on arrival.  Patient's blood pressure was checked at 15-minute intervals,Over the course of 45 minutes no progress was made in lowering his blood pressure with clonidine 0.3 mg.  Patient reports being a current every day smoker of cigarettes, also uses marijuana recreationally.  Patient denies chest pain, shortness of breath, chest pressure, altered mental status, history of allergies or asthma.  Patient has been diagnosed with sleep apnea, states not currently using his CPAP machine.  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  Chest x-ray performed revealed bilateral pleural effusions and pulmonary opacities, patient was referred to infectious disease in August of this year, CT scan was ordered, I do not see that this was ever done or that he had any follow-up of this issue.  The history is provided by the patient.   Past Medical History:  Diagnosis Date   Sleep apnea    There are no problems to display for this patient.  History reviewed. No pertinent surgical history.  Home Medications    Prior to Admission medications   Medication Sig Start Date End Date Taking? Authorizing Provider  amLODipine-olmesartan (AZOR) 10-40 MG tablet Take 1 tablet by mouth daily. 10/07/21  Yes [provider]    Family History History reviewed. No pertinent family history. Social History Social History    Tobacco Use   Smoking status: Every Day    Types: Cigarettes   Smokeless tobacco: Never  Substance Use Topics   Alcohol use: Yes   Drug use: Yes    Types: Marijuana   Allergies   Patient has no known allergies.  Review of Systems Review of Systems Pertinent findings revealed after performing a 14 point review of systems has been noted in the history of present illness.  Triage Vital Signs ED Triage Vitals  Enc Vitals Group     BP 04/03/21 0827 (!) 147/82     Pulse Rate 04/03/21 0827 72     Resp 04/03/21 0827 18     Temp 04/03/21 0827 98.3 F (36.8 C)     Temp Source 04/03/21 0827 Oral     SpO2 04/03/21 0827 98 %     Weight --      Height --      Head Circumference --      Peak Flow --      Pain Score 04/03/21 0826 5     Pain Loc --      Pain Edu? --      Excl. in [place]? --   No data found.  Updated Vital Signs BP (!) 201/116 (BP Location: Left Arm)   Pulse 75   Temp 98.4 F (36.9 C) (Oral)   Resp 18   SpO2 92%   Physical Exam Vitals and nursing note reviewed.  Constitutional:      General: He is not in  acute distress.    Appearance: Normal appearance. He is normal weight. He is not ill-appearing.  HENT:     Head: Normocephalic and atraumatic.  Eyes:     Extraocular Movements: Extraocular movements intact.     Conjunctiva/sclera: Conjunctivae normal.     Pupils: Pupils are equal, round, and reactive to light.  Cardiovascular:     Rate and Rhythm: Normal rate and regular rhythm.     Heart sounds: No murmur heard.    No friction rub. No gallop.  Pulmonary:     Effort: Pulmonary effort is normal. No tachypnea, bradypnea, accessory muscle usage, prolonged expiration, respiratory distress or retractions.     Breath sounds: Examination of the right-upper field reveals decreased breath sounds. Examination of the left-upper field reveals decreased breath sounds. Examination of the right-middle field reveals decreased breath sounds and wheezing. Examination of the  left-middle field reveals decreased breath sounds and wheezing. Examination of the right-lower field reveals decreased breath sounds and wheezing. Examination of the left-lower field reveals decreased breath sounds and wheezing. Decreased breath sounds and wheezing present. No rhonchi or rales.  Musculoskeletal:        General: Normal range of motion.     Cervical back: Normal range of motion and neck supple.  Skin:    General: Skin is warm and dry.  Neurological:     General: No focal deficit present.     Mental Status: He is alert and oriented to person, place, and time. Mental status is at baseline.  Psychiatric:        Mood and Affect: Mood normal.        Behavior: Behavior normal.        Thought Content: Thought content normal.        Judgment: Judgment normal.     UC Couse / Diagnostics / Procedures:     Radiology No results found.  Procedures Procedures (including critical care time) EKG  Pending results:  Labs Reviewed - No data to display  Medications Ordered in UC: Medications  cloNIDine (CATAPRES) tablet 0.3 mg (0.3 mg Oral Given 06/01/22 1902)    UC Diagnoses / Final Clinical Impressions(s)   I have reviewed the triage vital signs and the nursing notes.  Pertinent labs & imaging results that were available during my care of the patient were reviewed by me and considered in my medical decision making (see chart for details).    Final diagnoses:  Asymptomatic hypertensive urgency  Wheezing  Shortness of breath  Positive TB test  I was unable to provide patient with steroid injection or nebulized albuterol treatment due to dangerously elevated blood pressure refractory to clonidine, the only antihypertensive medication we have available here at urgent care.  Patient was advised to the emergency room for further evaluation and treatment of his hypertension, wheezing and cough.   ED Prescriptions   None    PDMP not reviewed this encounter.  Disposition Upon  Discharge:  Condition: stable for discharge Emergency Room via private vehicle operated by patient.  Patient verbally agrees to go now.    Patient presented with an acute illness with associated systemic symptoms and significant discomfort requiring urgent management. In my opinion, this is a condition that a prudent lay person (someone who possesses an average knowledge of health and medicine) may potentially expect to result in complications if not addressed urgently such as respiratory distress, impairment of bodily function or dysfunction of bodily organs.   As such, the patient has been evaluated and  assessed, work-up was performed and treatment was provided in alignment with urgent care protocols and evidence based medicine.  The patient will follow up with their current PCP if and as advised. If the patient does not currently have a PCP we will assist them in obtaining one.   The patient may need specialty follow up if the symptoms continue, in spite of conservative treatment and management, for further workup, evaluation, consultation and treatment as clinically indicated and appropriate.  Patient/parent/caregiver verbalized understanding and agreement of plan as discussed.  All questions were addressed during visit.  Please see discharge instructions below for further details of plan.  This office note has been dictated using Teaching laboratory technician.  Unfortunately, this method of dictation can sometimes lead to typographical or grammatical errors.  I apologize for your inconvenience in advance if this occurs.  Please do not hesitate to reach out to me if clarification is needed.      Theadora Rama Scales, PA-C 06/01/22 2003

## 2022-06-01 NOTE — ED Triage Notes (Signed)
Pt present coughing with congestion. Symptom started three days ago. Pt states he has cough so hard that he lost his voice.

## 2022-06-01 NOTE — ED Notes (Signed)
Patient is being discharged from the Urgent Care and sent to the Emergency Department via POV. Per L. Morgan-Scales PA-C , patient is in need of higher level of care due to need for further evaluation (hypertension). Patient is aware and verbalizes understanding of plan of care. Patient is A&Ox4. Vitals:   06/01/22 1922 06/01/22 1937  BP: (!) 187/114 (!) 201/116  Pulse:    Resp:    Temp:    SpO2:

## 2022-06-24 ENCOUNTER — Ambulatory Visit
Admission: EM | Admit: 2022-06-24 | Discharge: 2022-06-24 | Disposition: A | Discharge status: Home / Self Care | Payer: 59 | Attending: Nurse Practitioner | Admitting: Nurse Practitioner

## 2022-06-24 DIAGNOSIS — H1033 Unspecified acute conjunctivitis, bilateral: Secondary | ICD-10-CM

## 2022-06-24 HISTORY — DX: Essential (primary) hypertension: I10

## 2022-06-24 MED ORDER — OFLOXACIN 0.3 % OP SOLN: 1.0000 [drp] | Freq: Four times a day (QID) | OPHTHALMIC | 0 refills | Status: AC

## 2022-06-24 NOTE — ED Triage Notes (Signed)
Pt noticed redness in eyes yesterday.  Bilateral eyes are red, mildly irritation, drainage.  His toddler has pinkeye.

## 2022-06-24 NOTE — ED Provider Notes (Signed)
[facility]    CSN: [id number] Arrival date & time: 06/24/22  1922      History   Chief Complaint Chief Complaint  Patient presents with   Conjunctivitis    HPI Jose Thornton is a 52 y.o. male who presents for evaluation of an eye complaint for the past 1-2 day. Patient reports bilateral eye redness with purulent discharge and denies  loss or change in vision, photophobia, trauma or pain. Reports no URI or allergy sx. or contacts.  Patient send has pinkeye.  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  Patient has taken nothing OTC for symptoms.  Patient BP noted to be elevated on intake at 217/112.  He does have a history of hypertension and is currently taking amlodipine-olmesartan daily.  He does state he skips a few days occasionally but did state he took it today.  He denies any headache, chest pain, visual changes, shortness of breath, dizziness.  He does have a PCP follow-up in 2 weeks regarding his blood pressure.  Pt has no other concerns at this time.    Conjunctivitis    Past Medical History:  Diagnosis Date   Hypertension    Sleep apnea     There are no problems to display for this patient.   History reviewed. No pertinent surgical history.     Home Medications    Prior to Admission medications   Medication Sig Start Date End Date Taking? Authorizing Provider  amLODipine-olmesartan (AZOR) 10-40 MG tablet Take 1 tablet by mouth daily. 10/07/21  Yes [provider]  ofloxacin (OCUFLOX) 0.3 % ophthalmic solution Place 1 drop into both eyes 4 (four) times daily for 7 days. 06/24/22 07/01/22 Yes Melynda Ripple, NP    Family History History reviewed. No pertinent family history.  Social History Social History   Tobacco Use   Smoking status: Former    Types: Cigarettes    Quit date: 07/08/2021    Years since quitting: 0.9   Smokeless tobacco: Never  Substance Use Topics   Alcohol use: Yes   Drug use: Yes     Types: Marijuana     Allergies   Patient has no known allergies.   Review of Systems Review of Systems  Eyes:  Positive for discharge and redness.     Physical Exam Triage Vital Signs ED Triage Vitals  Enc Vitals Group     BP 06/24/22 1959 (!) 217/112     Pulse Rate 06/24/22 1959 74     Resp 06/24/22 1959 20     Temp 06/24/22 1959 98 F (36.7 C)     Temp Source 06/24/22 1959 Oral     SpO2 06/24/22 1959 96 %     Weight --      Height --      Head Circumference --      Peak Flow --      Pain Score 06/24/22 1957 0     Pain Loc --      Pain Edu? --      Excl. in [place]? --    No data found.  Updated Vital Signs BP (!) 162/110 (BP Location: Right Arm)   Pulse 74   Temp 98 F (36.7 C) (Oral)   Resp 20   SpO2 96%   Visual Acuity Right Eye Distance:   Left Eye Distance:   Bilateral Distance:    Right Eye Near:   Left Eye Near:    Bilateral Near:  Physical Exam Vitals and nursing note reviewed.  Constitutional:      Appearance: Normal appearance.  HENT:     Head: Normocephalic and atraumatic.  Eyes:     General: Lids are normal.     Conjunctiva/sclera:     Right eye: Right conjunctiva is injected. No chemosis, exudate or hemorrhage.    Left eye: Left conjunctiva is injected. No chemosis, exudate or hemorrhage.    Pupils: Pupils are equal, round, and reactive to light.  Cardiovascular:     Rate and Rhythm: Normal rate.  Pulmonary:     Effort: Pulmonary effort is normal.  Skin:    General: Skin is warm and dry.  Neurological:     General: No focal deficit present.     Mental Status: He is alert and oriented to person, place, and time.  Psychiatric:        Mood and Affect: Mood normal.        Behavior: Behavior normal.      UC Treatments / Results  Labs (all labs ordered are listed, but only abnormal results are displayed) Labs Reviewed - No data to display  EKG   Radiology No results found.  Procedures Procedures (including critical  care time)  Medications Ordered in UC Medications - No data to display  Initial Impression / Assessment and Plan / UC Course  I have reviewed the triage vital signs and the nursing notes.  Pertinent labs & imaging results that were available during my care of the patient were reviewed by me and considered in my medical decision making (see chart for details).     Reviewed different types of conjunctivitis with patient.  Given known exposure will treat with ofloxacin antibiotic drops Avoid contact use until treatment is completed and symptoms resolved Warm compresses to the eye as needed Discussed patient blood pressure with him.  He denies any current symptoms.  Blood pressure did come down on recheck to 162/110.  As he is asymptomatic and being followed by PCP currently on treatment advised he continue his current blood pressure medication.  Discussed DASH diet and salt reduction Follow-up with PCP as scheduled appointment in 2 weeks. Strict ER precautions reviewed and patient verbalized understanding  Final Clinical Impressions(s) / UC Diagnoses   Final diagnoses:  Acute bacterial conjunctivitis of both eyes     Discharge Instructions      Antibiotic eyedrops as prescribed Compresses to the eye as needed Please avoid wearing your contacts until you are done with treatment Please follow-up with your PCP regarding your blood pressure.  Keep a BP log and take this to your primary Avoid salt Please go to the emergency room ASAP if you have any worsening symptoms     ED Prescriptions     Medication Sig Dispense Auth. Provider   ofloxacin (OCUFLOX) 0.3 % ophthalmic solution Place 1 drop into both eyes 4 (four) times daily for 7 days. 5 mL Melynda Ripple, NP      PDMP not reviewed this encounter.   Melynda Ripple, NP 06/24/22 2020

## 2022-06-24 NOTE — Discharge Instructions (Addendum)
Antibiotic eyedrops as prescribed Compresses to the eye as needed Please avoid wearing your contacts until you are done with treatment Please follow-up with your PCP regarding your blood pressure.  Keep a BP log and take this to your primary Avoid salt Please go to the emergency room ASAP if you have any worsening symptoms

## 2023-01-02 ENCOUNTER — Ambulatory Visit
Admission: EM | Admit: 2023-01-02 | Discharge: 2023-01-02 | Disposition: A | Discharge status: Home / Self Care | Payer: 59 | Attending: Physician Assistant | Admitting: Physician Assistant

## 2023-01-02 DIAGNOSIS — K0889 Other specified disorders of teeth and supporting structures: Secondary | ICD-10-CM

## 2023-01-02 MED ORDER — KETOROLAC TROMETHAMINE 30 MG/ML IJ SOLN
30.0000 mg | Freq: Once | INTRAMUSCULAR | Status: AC
Start: 2023-01-02 — End: 2023-01-02
  Administered 2023-01-02: 30 mg via INTRAMUSCULAR

## 2023-01-02 MED ORDER — AMOXICILLIN-POT CLAVULANATE 875-125 MG PO TABS: 1.0000 | ORAL_TABLET | Freq: Two times a day (BID) | ORAL | 0 refills | Status: DC

## 2023-01-02 NOTE — ED Triage Notes (Signed)
Pt presents to UC w/ c/o left lower dental pain x3 days. Pt does not have a dentist.

## 2023-01-02 NOTE — ED Provider Notes (Signed)
[facility]    CSN: [id number] Arrival date & time: 01/02/23  1523      History   Chief Complaint No chief complaint on file.   HPI Jose Thornton is a 52 y.o. male.   Patient complains of left upper dental pain that started about 3 days ago.  Here complains of facial swelling.  Denies fever, chills, body aches.  He does not have a dentist.  He has been taking Tylenol with minimal relief.    Past Medical History:  Diagnosis Date   Hypertension    Sleep apnea     There are no problems to display for this patient.   History reviewed. No pertinent surgical history.     Home Medications    Prior to Admission medications   Medication Sig Start Date End Date Taking? Authorizing Provider  amoxicillin-clavulanate (AUGMENTIN) 875-125 MG tablet Take 1 tablet by mouth every 12 (twelve) hours. 01/02/23  Yes Ward, Tylene Fantasia, PA-C  amLODipine-olmesartan (AZOR) 10-40 MG tablet Take 1 tablet by mouth daily. 10/07/21   [provider]    Family History History reviewed. No pertinent family history.  Social History Social History   Tobacco Use   Smoking status: Former    Current packs/day: 0.00    Types: Cigarettes    Quit date: 07/08/2021    Years since quitting: 1.4   Smokeless tobacco: Never  Substance Use Topics   Alcohol use: Yes   Drug use: Yes    Types: Marijuana     Allergies   Patient has no known allergies.   Review of Systems Review of Systems  Constitutional:  Negative for chills and fever.  HENT:  Positive for dental problem. Negative for ear pain and sore throat.   Eyes:  Negative for pain and visual disturbance.  Respiratory:  Negative for cough and shortness of breath.   Cardiovascular:  Negative for chest pain and palpitations.  Gastrointestinal:  Negative for abdominal pain and vomiting.  Genitourinary:  Negative for dysuria and hematuria.  Musculoskeletal:  Negative for arthralgias and back pain.  Skin:  Negative for color  change and rash.  Neurological:  Negative for seizures and syncope.  All other systems reviewed and are negative.    Physical Exam Triage Vital Signs ED Triage Vitals  Encounter Vitals Group     BP 01/02/23 1547 (!) 164/82     Systolic BP Percentile --      Diastolic BP Percentile --      Pulse Rate 01/02/23 1547 (!) 51     Resp 01/02/23 1547 16     Temp 01/02/23 1547 98.2 F (36.8 C)     Temp Source 01/02/23 1547 Oral     SpO2 01/02/23 1547 95 %     Weight --      Height --      Head Circumference --      Peak Flow --      Pain Score 01/02/23 1546 3     Pain Loc --      Pain Education --      Exclude from Growth Chart --    No data found.  Updated Vital Signs BP (!) 164/82 (BP Location: Right Arm)   Pulse (!) 51   Temp 98.2 F (36.8 C) (Oral)   Resp 16   SpO2 95%   Visual Acuity Right Eye Distance:   Left Eye Distance:   Bilateral Distance:    Right Eye Near:   Left Eye Near:  Bilateral Near:     Physical Exam Vitals and nursing note reviewed.  Constitutional:      General: He is not in acute distress.    Appearance: He is well-developed.  HENT:     Head: Normocephalic and atraumatic.     Mouth/Throat:     Dentition: Abnormal dentition. Gingival swelling and dental caries present.  Eyes:     Conjunctiva/sclera: Conjunctivae normal.  Cardiovascular:     Rate and Rhythm: Normal rate and regular rhythm.     Heart sounds: No murmur heard. Pulmonary:     Effort: Pulmonary effort is normal. No respiratory distress.     Breath sounds: Normal breath sounds.  Abdominal:     Palpations: Abdomen is soft.     Tenderness: There is no abdominal tenderness.  Musculoskeletal:        General: No swelling.     Cervical back: Neck supple.  Skin:    General: Skin is warm and dry.     Capillary Refill: Capillary refill takes less than 2 seconds.  Neurological:     Mental Status: He is alert.  Psychiatric:        Mood and Affect: Mood normal.      UC  Treatments / Results  Labs (all labs ordered are listed, but only abnormal results are displayed) Labs Reviewed - No data to display  EKG   Radiology No results found.  Procedures Procedures (including critical care time)  Medications Ordered in UC Medications  ketorolac (TORADOL) 30 MG/ML injection 30 mg (30 mg Intramuscular Given 01/02/23 1606)    Initial Impression / Assessment and Plan / UC Course  I have reviewed the triage vital signs and the nursing notes.  Pertinent labs & imaging results that were available during my care of the patient were reviewed by me and considered in my medical decision making (see chart for details).     Will start antibiotic for presumptive dental abscess.  Toradol given in clinic today.  Supportive care discussed.  Advised to contact dentist ASAP.  Return precautions discussed. Final Clinical Impressions(s) / UC Diagnoses   Final diagnoses:  Pain, dental     Discharge Instructions      Take antibiotic as prescribed Recommend Tylenol and Ibuprofen as needed Can apply ice to face Call dentist tomorrow.    ED Prescriptions     Medication Sig Dispense Auth. Provider   amoxicillin-clavulanate (AUGMENTIN) 875-125 MG tablet Take 1 tablet by mouth every 12 (twelve) hours. 14 tablet Ward, Tylene Fantasia, PA-C      PDMP not reviewed this encounter.   Ward, Tylene Fantasia, PA-C 01/02/23 [phone]

## 2023-01-02 NOTE — Discharge Instructions (Signed)
Take antibiotic as prescribed Recommend Tylenol and Ibuprofen as needed Can apply ice to face Call dentist tomorrow.

## 2023-01-26 LAB — EXTERNAL GENERIC LAB PROCEDURE: COLOGUARD: POSITIVE — AB

## 2023-07-12 ENCOUNTER — Ambulatory Visit
Admission: EM | Admit: 2023-07-12 | Discharge: 2023-07-12 | Disposition: A | Discharge status: Home / Self Care | Payer: Self-pay | Attending: Family Medicine | Admitting: Family Medicine

## 2023-07-12 DIAGNOSIS — J069 Acute upper respiratory infection, unspecified: Secondary | ICD-10-CM

## 2023-07-12 DIAGNOSIS — I10 Essential (primary) hypertension: Secondary | ICD-10-CM

## 2023-07-12 MED ORDER — AMLODIPINE-OLMESARTAN 10-40 MG PO TABS: 1.0000 | ORAL_TABLET | Freq: Every day | ORAL | 0 refills | Status: DC

## 2023-07-12 MED ORDER — BENZONATATE 200 MG PO CAPS
200.0000 mg | ORAL_CAPSULE | Freq: Three times a day (TID) | ORAL | 0 refills | Status: DC | PRN
Start: 2023-07-12 — End: 2023-07-22

## 2023-07-12 NOTE — Discharge Instructions (Signed)
 Start Tessalon  as needed for cough.  I have refilled your blood pressure medication.  Please follow-up with your PCP for any additional refills.  Lots of rest and fluids.  Follow-up with your PCP if your symptoms do not improve.  Please go to the ER for any worsening symptoms.  I hope you feel better soon!

## 2023-07-12 NOTE — ED Provider Notes (Signed)
 [facility]    CSN: [id number] Arrival date & time: 07/12/23  0801      History   Chief Complaint Chief Complaint  Patient presents with   Cough   Ear Pain    HPI Alekai Pocock is a 53 y.o. male  presents for evaluation of URI symptoms for 3 days. Patient reports associated symptoms of cough, congestion. Denies N/V/D, fevers, sore throat, ear pain, body aches, shortness of breath. Patient does not have a hx of asthma. Patient is an active smoker.   Reports sick contacts via his work.  Pt has taken Robitussin OTC for symptoms. Pt has no other concerns at this time.     Cough   Past Medical History:  Diagnosis Date   Hypertension    Sleep apnea     There are no active problems to display for this patient.   History reviewed. No pertinent surgical history.     Home Medications    Prior to Admission medications   Medication Sig Start Date End Date Taking? Authorizing Provider  benzonatate  (TESSALON ) 200 MG capsule Take 1 capsule (200 mg total) by mouth 3 (three) times daily as needed. 07/12/23  Yes Theta Leaf, Jodi R, NP  amLODipine -olmesartan  (AZOR ) 10-40 MG tablet Take 1 tablet by mouth daily. 07/12/23   Hasina Kreager, Jodi R, NP  amoxicillin -clavulanate (AUGMENTIN ) 875-125 MG tablet Take 1 tablet by mouth every 12 (twelve) hours. 01/02/23   Ward, Harlene PEDLAR, PA-C    Family History History reviewed. No pertinent family history.  Social History Social History   Tobacco Use   Smoking status: Former    Current packs/day: 0.00    Types: Cigarettes    Quit date: 07/08/2021    Years since quitting: 2.0   Smokeless tobacco: Never  Substance Use Topics   Alcohol use: Yes   Drug use: Yes    Types: Marijuana     Allergies   Patient has no known allergies.   Review of Systems Review of Systems  HENT:  Positive for congestion.   Respiratory:  Positive for cough.      Physical Exam Triage Vital Signs ED Triage Vitals [07/12/23 0815]  Encounter Vitals Group      BP (!) 196/98     Systolic BP Percentile      Diastolic BP Percentile      Pulse Rate 65     Resp 17     Temp 98.7 F (37.1 C)     Temp Source Oral     SpO2 97 %     Weight      Height      Head Circumference      Peak Flow      Pain Score 5     Pain Loc      Pain Education      Exclude from Growth Chart    No data found.  Updated Vital Signs BP (!) 196/98 (BP Location: Right Arm) Comment: pt has not taken BP meds, states he recently ran out  Pulse 65   Temp 98.7 F (37.1 C) (Oral)   Resp 17   SpO2 97%   Visual Acuity Right Eye Distance:   Left Eye Distance:   Bilateral Distance:    Right Eye Near:   Left Eye Near:    Bilateral Near:     Physical Exam Vitals and nursing note reviewed.  Constitutional:      General: He is not in acute distress.    Appearance: Normal  appearance. He is not ill-appearing or toxic-appearing.  HENT:     Head: Normocephalic and atraumatic.     Right Ear: Tympanic membrane and ear canal normal.     Left Ear: Tympanic membrane and ear canal normal.     Nose: Congestion present.     Mouth/Throat:     Mouth: Mucous membranes are moist.     Pharynx: No oropharyngeal exudate or posterior oropharyngeal erythema.  Eyes:     Pupils: Pupils are equal, round, and reactive to light.  Cardiovascular:     Rate and Rhythm: Normal rate and regular rhythm.     Heart sounds: Normal heart sounds.  Pulmonary:     Effort: Pulmonary effort is normal.     Breath sounds: Normal breath sounds. No wheezing or rhonchi.  Musculoskeletal:     Cervical back: Normal range of motion and neck supple.  Lymphadenopathy:     Cervical: No cervical adenopathy.  Skin:    General: Skin is warm and dry.  Neurological:     General: No focal deficit present.     Mental Status: He is alert and oriented to person, place, and time.  Psychiatric:        Mood and Affect: Mood normal.        Behavior: Behavior normal.      UC Treatments / Results  Labs (all labs  ordered are listed, but only abnormal results are displayed) Labs Reviewed - No data to display  EKG   Radiology No results found.  Procedures Procedures (including critical care time)  Medications Ordered in UC Medications - No data to display  Initial Impression / Assessment and Plan / UC Course  I have reviewed the triage vital signs and the nursing notes.  Pertinent labs & imaging results that were available during my care of the patient were reviewed by me and considered in my medical decision making (see chart for details).     Reviewed exam and symptoms with patient.  No red flags.  Discussed viral upper respiratory illness and symptomatic treatment.  Tessalon  as needed for cough.  Patient states he ran out of his BP meds 5 to 6 days ago.  He does have a PCP but due to schedule has not able to follow-up for additional refills.  I did send a refill of his blood pressure medication and did advise he should follow-up for any additional refills.  PCP follow-up 2 to 3 days for recheck.  Strict ER precautions reviewed and patient verbalized understanding. Final Clinical Impressions(s) / UC Diagnoses   Final diagnoses:  Viral upper respiratory illness  Hypertension, unspecified type     Discharge Instructions      Start Tessalon  as needed for cough.  I have refilled your blood pressure medication.  Please follow-up with your PCP for any additional refills.  Lots of rest and fluids.  Follow-up with your PCP if your symptoms do not improve.  Please go to the ER for any worsening symptoms.  I hope you feel better soon!     ED Prescriptions     Medication Sig Dispense Auth. Provider   benzonatate  (TESSALON ) 200 MG capsule Take 1 capsule (200 mg total) by mouth 3 (three) times daily as needed. 20 capsule Victoria Euceda, Jodi R, NP   amLODipine -olmesartan  (AZOR ) 10-40 MG tablet Take 1 tablet by mouth daily. 30 tablet Chaise Passarella, Jodi R, NP      PDMP not reviewed this encounter.   Loreda Myla SAUNDERS, NP 07/12/23 0830

## 2023-07-12 NOTE — ED Triage Notes (Signed)
Pt presents with c/o cough, and chest congestion x 3 days. Reports chest pain when coughing. C/o rt ear ache denies fullness.   Home interventions: Robitussin

## 2023-07-22 ENCOUNTER — Ambulatory Visit
Admission: EM | Admit: 2023-07-22 | Discharge: 2023-07-22 | Disposition: A | Discharge status: Home / Self Care | Payer: Self-pay | Attending: Family Medicine | Admitting: Family Medicine

## 2023-07-22 DIAGNOSIS — F17200 Nicotine dependence, unspecified, uncomplicated: Secondary | ICD-10-CM

## 2023-07-22 DIAGNOSIS — J40 Bronchitis, not specified as acute or chronic: Secondary | ICD-10-CM

## 2023-07-22 DIAGNOSIS — J329 Chronic sinusitis, unspecified: Secondary | ICD-10-CM

## 2023-07-22 MED ORDER — AMOXICILLIN-POT CLAVULANATE 875-125 MG PO TABS: 1.0000 | ORAL_TABLET | Freq: Two times a day (BID) | ORAL | 0 refills | Status: DC

## 2023-07-22 MED ORDER — PREDNISONE 20 MG PO TABS: ORAL_TABLET | ORAL | 0 refills | Status: DC

## 2023-07-22 MED ORDER — PROMETHAZINE-DM 6.25-15 MG/5ML PO SYRP: 5.0000 mL | ORAL_SOLUTION | Freq: Three times a day (TID) | ORAL | 0 refills | Status: DC | PRN

## 2023-07-22 NOTE — ED Triage Notes (Signed)
Pt c/o cough, head/chest congestion-seen 2/4 for same-sx have cont'd-NAD-steady gait

## 2023-07-22 NOTE — Discharge Instructions (Addendum)
Start Augmentin and prednisone for sinobronchitis. Use cough syrup as needed.   For diabetes or elevated blood sugar, please make sure you are limiting and avoiding starchy, carbohydrate foods like pasta, breads, sweet breads, pastry, rice, potatoes, desserts. These foods can elevate your blood sugar. Also, limit and avoid drinks that contain a lot of sugar such as sodas, sweet teas, fruit juices.  Drinking plain water will be much more helpful, try 64 ounces of water daily.  It is okay to flavor your water naturally by cutting cucumber, lemon, mint or lime, placing it in a picture with water and drinking it over a period of 24-48 hours as long as it remains refrigerated.  For elevated blood pressure, make sure you are monitoring salt in your diet.  Do not eat restaurant foods and limit processed foods at home. I highly recommend you prepare and cook your own foods at home.  Processed foods include things like frozen meals, pre-seasoned meats and dinners, deli meats, canned foods as these foods contain a high amount of sodium/salt.  Make sure you are paying attention to sodium labels on foods you buy at the grocery store. Buy your spices separately such as garlic powder, onion powder, cumin, cayenne, parsley flakes so that you can avoid seasonings that contain salt. However, salt-free seasonings are available and can be used, an example is Mrs. Dash and includes a lot of different mixtures that do not contain salt.  Lastly, when cooking using oils that are healthier for you is important. This includes olive oil, avocado oil, canola oil. We have discussed a lot of foods to avoid but below is a list of foods that can be very healthy to use in your diet whether it is for diabetes, cholesterol, high blood pressure, or in general healthy eating.  Salads - kale, spinach, cabbage, spring mix, arugula Fruits - avocadoes, berries (blueberries, raspberries, blackberries), apples, oranges, pomegranate, grapefruit,  kiwi Vegetables - asparagus, cauliflower, broccoli, green beans, brussel sprouts, bell peppers, beets; stay away from or limit starchy vegetables like potatoes, carrots, peas Other general foods - kidney beans, egg whites, almonds, walnuts, sunflower seeds, pumpkin seeds, fat free yogurt, almond milk, flax seeds, quinoa, oats  Meat - It is better to eat lean meats and limit your red meat including pork to once a week.  Wild caught fish, chicken breast are good options as they tend to be leaner sources of good protein. Still be mindful of the sodium labels for the meats you buy.  DO NOT EAT ANY FOODS ON THIS LIST THAT YOU ARE ALLERGIC TO. For more specific needs, I highly recommend consulting a dietician or nutritionist but this can definitely be a good starting point.

## 2023-07-22 NOTE — ED Provider Notes (Signed)
[facility]  Note:  This document was prepared using Conservation officer, historic buildings and may include unintentional dictation errors.  MRN: [id number] DOB: 1971-05-14  Subjective:   Jose Thornton is a 53 y.o. male presenting for 2-week history of persistent worsening sinus congestion, sinus drainage, chest congestion, shortness of breath and wheezing.  Patient was seen on 07/12/2023.  Was recommended supportive care for viral respiratory infection as appropriate.  Patient has progressed in his illness.  No asthma.  He is a smoker, does cigars and marijuana.  Works at a daycare.  Denies any stroke symptoms including headache, weakness, numbness or tingling, vision changes, chest pain.  No current facility-administered medications for this encounter.  Current Outpatient Medications:    amLODipine-olmesartan (AZOR) 10-40 MG tablet, Take 1 tablet by mouth daily., Disp: 30 tablet, Rfl: 0   No Known Allergies  Past Medical History:  Diagnosis Date   Hypertension    Sleep apnea      History reviewed. No pertinent surgical history.  No family history on file.  Social History   Tobacco Use   Smoking status: Former    Current packs/day: 0.00    Types: Cigarettes    Quit date: 07/08/2021    Years since quitting: 2.0   Smokeless tobacco: Never  Vaping Use   Vaping status: Never Used  Substance Use Topics   Alcohol use: Yes    Comment: occ   Drug use: Yes    Types: Marijuana    ROS   Objective:   Vitals: BP (!) 168/98 (BP Location: Right Arm)   Pulse 78   Temp 98.2 F (36.8 C)   Resp 20   SpO2 96%   Physical Exam Constitutional:      General: He is not in acute distress.    Appearance: Normal appearance. He is well-developed and normal weight. He is not ill-appearing, toxic-appearing or diaphoretic.  HENT:     Head: Normocephalic and atraumatic.     Right Ear: Tympanic membrane, ear canal and external ear normal. No drainage, swelling or  tenderness. No middle ear effusion. There is no impacted cerumen. Tympanic membrane is not erythematous or bulging.     Left Ear: Tympanic membrane, ear canal and external ear normal. No drainage, swelling or tenderness.  No middle ear effusion. There is no impacted cerumen. Tympanic membrane is not erythematous or bulging.     Nose: Congestion and rhinorrhea present.     Mouth/Throat:     Mouth: Mucous membranes are moist.     Pharynx: No oropharyngeal exudate or posterior oropharyngeal erythema.  Eyes:     General: No scleral icterus.       Right eye: No discharge.        Left eye: No discharge.     Extraocular Movements: Extraocular movements intact.     Conjunctiva/sclera: Conjunctivae normal.  Cardiovascular:     Rate and Rhythm: Normal rate and regular rhythm.     Heart sounds: Normal heart sounds. No murmur heard.    No friction rub. No gallop.  Pulmonary:     Effort: Pulmonary effort is normal. No respiratory distress.     Breath sounds: No stridor. Rhonchi present. No wheezing or rales.  Musculoskeletal:     Cervical back: Normal range of motion and neck supple. No rigidity. No muscular tenderness.  Neurological:     General: No focal deficit present.     Mental Status: He is alert and oriented to person, place, and  time.     Cranial Nerves: No cranial nerve deficit.     Motor: No weakness.     Coordination: Coordination normal.     Gait: Gait normal.  Psychiatric:        Mood and Affect: Mood normal.        Behavior: Behavior normal.        Thought Content: Thought content normal.     Assessment and Plan :   PDMP not reviewed this encounter.  1. Sinobronchitis   2. Smoker    Recommended managing for sinobronchitis with Augmentin, prednisone and supportive care.  Emphasized need for hypertensive friendly diet.  Maintain medical compliance with his blood pressure medications.  Counseled patient on potential for adverse effects with medications prescribed/recommended  today, ER and return-to-clinic precautions discussed, patient verbalized understanding.    Wallis Bamberg, PA-C 07/22/23 1024

## 2023-12-16 ENCOUNTER — Ambulatory Visit
Admission: EM | Admit: 2023-12-16 | Discharge: 2023-12-16 | Disposition: A | Discharge status: Home / Self Care | Payer: PRIVATE HEALTH INSURANCE | Attending: Family Medicine | Admitting: Family Medicine

## 2023-12-16 ENCOUNTER — Other Ambulatory Visit: Payer: Self-pay

## 2023-12-16 DIAGNOSIS — I10 Essential (primary) hypertension: Secondary | ICD-10-CM

## 2023-12-16 DIAGNOSIS — H109 Unspecified conjunctivitis: Secondary | ICD-10-CM | POA: Diagnosis not present

## 2023-12-16 DIAGNOSIS — I160 Hypertensive urgency: Secondary | ICD-10-CM

## 2023-12-16 DIAGNOSIS — B9689 Other specified bacterial agents as the cause of diseases classified elsewhere: Secondary | ICD-10-CM | POA: Diagnosis not present

## 2023-12-16 MED ORDER — AMLODIPINE-OLMESARTAN 10-40 MG PO TABS
1.0000 | ORAL_TABLET | Freq: Every day | ORAL | 0 refills | Status: AC
Start: 2023-12-16 — End: ?

## 2023-12-16 MED ORDER — TOBRAMYCIN 0.3 % OP SOLN: 1.0000 [drp] | OPHTHALMIC | 0 refills | Status: AC

## 2023-12-16 NOTE — Discharge Instructions (Addendum)
 I have refilled your blood pressure medicine for 30 days. Please follow up urgently with your PCP for a recheck on your blood pressure and future refills.   For the eyes, start taking tobramycin  eye drops asap. Use 1 drop to each eye every 4 hours while awake. Follow up urgently with your eye specialist and if you cannot get an appointment with them within the next week then follow-up with Dr. Octavia.  He is the eye specialist on-call with [facility].  For now, avoid the use of any contact lenses.  For diabetes or elevated blood sugar, please make sure you are limiting and avoiding starchy, carbohydrate foods like pasta, breads, sweet breads, pastry, rice, potatoes, desserts. These foods can elevate your blood sugar. Also, limit and avoid drinks that contain a lot of sugar such as sodas, sweet teas, fruit juices.  Drinking plain water will be much more helpful, try 64 ounces of water daily.  It is okay to flavor your water naturally by cutting cucumber, lemon, mint or lime, placing it in a picture with water and drinking it over a period of 24-48 hours as long as it remains refrigerated.  For elevated blood pressure, make sure you are monitoring salt in your diet.  Do not eat restaurant foods and limit processed foods at home. I highly recommend you prepare and cook your own foods at home.  Processed foods include things like frozen meals, pre-seasoned meats and dinners, deli meats, canned foods as these foods contain a high amount of sodium/salt.  Make sure you are paying attention to sodium labels on foods you buy at the grocery store. Buy your spices separately such as garlic powder, onion powder, cumin, cayenne, parsley flakes so that you can avoid seasonings that contain salt. However, salt-free seasonings are available and can be used, an example is Mrs. Dash and includes a lot of different mixtures that do not contain salt.  Lastly, when cooking using oils that are healthier for you is important. This  includes olive oil, avocado oil, canola oil. We have discussed a lot of foods to avoid but below is a list of foods that can be very healthy to use in your diet whether it is for diabetes, cholesterol, high blood pressure, or in general healthy eating.  Salads - kale, spinach, cabbage, spring mix, arugula Fruits - avocadoes, berries (blueberries, raspberries, blackberries), apples, oranges, pomegranate, grapefruit, kiwi Vegetables - asparagus, cauliflower, broccoli, green beans, brussel sprouts, bell peppers, beets; stay away from or limit starchy vegetables like potatoes, carrots, peas Other general foods - kidney beans, egg whites, almonds, walnuts, sunflower seeds, pumpkin seeds, fat free yogurt, almond milk, flax seeds, quinoa, oats  Meat - It is better to eat lean meats and limit your red meat including pork to once a week.  Wild caught fish, chicken breast are good options as they tend to be leaner sources of good protein. Still be mindful of the sodium labels for the meats you buy.  DO NOT EAT ANY FOODS ON THIS LIST THAT YOU ARE ALLERGIC TO. For more specific needs, I highly recommend consulting a dietician or nutritionist but this can definitely be a good starting point.

## 2023-12-16 NOTE — ED Provider Notes (Signed)
 [facility]  Note:  This document was prepared using Conservation officer, historic buildings and may include unintentional dictation errors.  MRN: [id number] DOB: 1970-08-19  Subjective:   Jose Thornton is a 53 y.o. male presenting for 1 day history of bilateral eye drainage, redness and irritation.  No vision changes, halos, photophobia, tunnel vision.  Patient reports history of severe eye deficits but wears his contact lenses consistently.  Does not have eyeglasses.  Last follow-up with his ophthalmologist was more than a year ago.  No history of glaucoma.  Regarding his blood pressure, denies headache, confusion, weakness, numbness or tingling, chest pain, hematuria, abdominal pain.  Denies history of heart disease, stroke.  Currently he has been out of his blood pressure medication, has not followed up with his PCP.  No current facility-administered medications for this encounter.  Current Outpatient Medications:    amLODipine -olmesartan  (AZOR ) 10-40 MG tablet, Take 1 tablet by mouth daily., Disp: 30 tablet, Rfl: 0   amoxicillin -clavulanate (AUGMENTIN ) 875-125 MG tablet, Take 1 tablet by mouth 2 (two) times daily., Disp: 20 tablet, Rfl: 0   predniSONE  (DELTASONE ) 20 MG tablet, Take 2 tablets daily with breakfast., Disp: 10 tablet, Rfl: 0   promethazine -dextromethorphan (PROMETHAZINE -DM) 6.25-15 MG/5ML syrup, Take 5 mLs by mouth 3 (three) times daily as needed for cough., Disp: 200 mL, Rfl: 0   No Known Allergies  Past Medical History:  Diagnosis Date   Hypertension    Sleep apnea      History reviewed. No pertinent surgical history.  History reviewed. No pertinent family history.  Social History   Tobacco Use   Smoking status: Some Days    Types: Cigars   Smokeless tobacco: Never  Vaping Use   Vaping status: Never Used  Substance Use Topics   Alcohol use: Yes    Comment: occ   Drug use: Yes    Types: Marijuana    ROS   Objective:    Vitals: BP (!) 184/116   Pulse 62   Temp 98.2 F (36.8 C) (Oral)   Resp 17   SpO2 94%   Physical Exam Constitutional:      General: He is not in acute distress.    Appearance: Normal appearance. He is well-developed and normal weight. He is not ill-appearing, toxic-appearing or diaphoretic.  HENT:     Head: Normocephalic and atraumatic.     Right Ear: External ear normal.     Left Ear: External ear normal.     Nose: Nose normal.     Mouth/Throat:     Mouth: Mucous membranes are moist.     Pharynx: Oropharynx is clear.  Eyes:     General: Lids are normal. Lids are everted, no foreign bodies appreciated. No scleral icterus.       Right eye: No foreign body, discharge or hordeolum.        Left eye: No foreign body, discharge or hordeolum.     Extraocular Movements: Extraocular movements intact.     Conjunctiva/sclera:     Right eye: Right conjunctiva is injected. No chemosis, exudate or hemorrhage.    Left eye: Left conjunctiva is injected. No chemosis, exudate or hemorrhage.    Pupils: Pupils are equal, round, and reactive to light.     Comments: Bilateral medial drainage that is clear yellowish.  Cardiovascular:     Rate and Rhythm: Normal rate and regular rhythm.     Heart sounds: Normal heart sounds. No murmur heard.    No  friction rub. No gallop.  Pulmonary:     Effort: Pulmonary effort is normal. No respiratory distress.     Breath sounds: Normal breath sounds. No stridor. No wheezing, rhonchi or rales.  Musculoskeletal:     Cervical back: Normal range of motion.  Neurological:     Mental Status: He is alert and oriented to person, place, and time.     Cranial Nerves: No cranial nerve deficit.     Motor: No weakness.     Coordination: Coordination normal.     Gait: Gait normal.     Deep Tendon Reflexes: Reflexes normal.     Comments: Negative Romberg and pronator drift.  No facial asymmetry.  Psychiatric:        Mood and Affect: Mood normal.        Behavior:  Behavior normal.        Thought Content: Thought content normal.        Judgment: Judgment normal.     Assessment and Plan :   PDMP not reviewed this encounter.  1. Bacterial conjunctivitis of both eyes   2. Hypertension, unspecified type   3. Hypertensive urgency    Will manage for bacterial conjunctivitis of both eyes likely related to his contact lens use.  Recommended tobramycin .  Discussed possibility of glaucoma but given lack of pain, vision change and limited ability in the clinic to measure ocular pressure advised that patient follow-up urgently with his ophthalmologist or the ophthalmologist on-call with [facility] through [facility].  No signs of hypertensive emergency.  Emphasized need for medical compliance with his blood pressure.  Refilled his blood pressure medication.  Follow-up urgently with his PCP.  Counseled patient on potential for adverse effects with medications prescribed/recommended today, ER and return-to-clinic precautions discussed, patient verbalized understanding.    Christopher Savannah, PA-C 12/16/23 1152

## 2023-12-16 NOTE — ED Triage Notes (Signed)
 Pt c/o both eyes erythematous and watery started yesterday upon waking. Pt states he does use contacts. Pt denies drainage. Pt states used visine and changed contact solution, but it has not helped. Pt states the left eye is worse. Pt's left sclera is erythematous. Pt denies vision issues.
